# Patient Record
Sex: Female | Born: 1980 | Race: White | Hispanic: No | Marital: Married | Smoking: Former smoker
Health system: Southern US, Community
[De-identification: ages and names within clinical notes are randomized; demographics above are authoritative.]

## PROBLEM LIST (undated history)

## (undated) DIAGNOSIS — E162 Hypoglycemia, unspecified: Secondary | ICD-10-CM

## (undated) DIAGNOSIS — G540 Brachial plexus disorders: Secondary | ICD-10-CM

## (undated) DIAGNOSIS — F419 Anxiety disorder, unspecified: Secondary | ICD-10-CM

## (undated) DIAGNOSIS — K589 Irritable bowel syndrome without diarrhea: Secondary | ICD-10-CM

## (undated) DIAGNOSIS — R12 Heartburn: Secondary | ICD-10-CM

## (undated) DIAGNOSIS — R300 Dysuria: Secondary | ICD-10-CM

## (undated) DIAGNOSIS — E282 Polycystic ovarian syndrome: Secondary | ICD-10-CM

## (undated) HISTORY — DX: Polycystic ovarian syndrome: E28.2

## (undated) HISTORY — PX: APPENDECTOMY: SHX54

## (undated) HISTORY — DX: Brachial plexus disorders: G54.0

## (undated) HISTORY — PX: TONSILLECTOMY: SUR1361

## (undated) HISTORY — DX: Heartburn: R12

## (undated) HISTORY — DX: Anxiety disorder, unspecified: F41.9

## (undated) HISTORY — DX: Irritable bowel syndrome, unspecified: K58.9

## (undated) HISTORY — DX: Dysuria: R30.0

## (undated) HISTORY — DX: Hypoglycemia, unspecified: E16.2

---
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
## 2006-07-12 ENCOUNTER — Observation Stay: Payer: Self-pay | Admitting: Obstetrics and Gynecology

## 2006-08-23 ENCOUNTER — Other Ambulatory Visit: Payer: Self-pay

## 2006-08-23 ENCOUNTER — Observation Stay: Payer: Self-pay | Admitting: Obstetrics and Gynecology

## 2006-09-24 ENCOUNTER — Inpatient Hospital Stay: Payer: Self-pay | Admitting: Obstetrics and Gynecology

## 2010-05-31 ENCOUNTER — Ambulatory Visit: Payer: Self-pay | Admitting: Internal Medicine

## 2010-07-14 ENCOUNTER — Ambulatory Visit: Payer: Self-pay | Admitting: Internal Medicine

## 2012-09-14 DIAGNOSIS — K589 Irritable bowel syndrome without diarrhea: Secondary | ICD-10-CM | POA: Insufficient documentation

## 2012-09-14 DIAGNOSIS — F419 Anxiety disorder, unspecified: Secondary | ICD-10-CM | POA: Insufficient documentation

## 2012-09-14 DIAGNOSIS — L02214 Cutaneous abscess of groin: Secondary | ICD-10-CM | POA: Insufficient documentation

## 2012-09-14 DIAGNOSIS — R002 Palpitations: Secondary | ICD-10-CM | POA: Insufficient documentation

## 2012-09-14 DIAGNOSIS — J302 Other seasonal allergic rhinitis: Secondary | ICD-10-CM | POA: Insufficient documentation

## 2012-09-14 DIAGNOSIS — E282 Polycystic ovarian syndrome: Secondary | ICD-10-CM | POA: Insufficient documentation

## 2012-09-14 DIAGNOSIS — K219 Gastro-esophageal reflux disease without esophagitis: Secondary | ICD-10-CM | POA: Insufficient documentation

## 2013-01-05 DIAGNOSIS — Z23 Encounter for immunization: Secondary | ICD-10-CM | POA: Insufficient documentation

## 2013-07-01 DIAGNOSIS — N390 Urinary tract infection, site not specified: Secondary | ICD-10-CM | POA: Insufficient documentation

## 2013-08-31 ENCOUNTER — Emergency Department: Payer: Self-pay | Admitting: Emergency Medicine

## 2013-08-31 LAB — CBC WITH DIFFERENTIAL/PLATELET
BASOS ABS: 0 10*3/uL (ref 0.0–0.1)
BASOS PCT: 0.6 %
Eosinophil #: 0 10*3/uL (ref 0.0–0.7)
Eosinophil %: 0.3 %
HCT: 40.4 % (ref 35.0–47.0)
HGB: 13.3 g/dL (ref 12.0–16.0)
LYMPHS ABS: 1.1 10*3/uL (ref 1.0–3.6)
LYMPHS PCT: 15.3 %
MCH: 30.3 pg (ref 26.0–34.0)
MCHC: 32.8 g/dL (ref 32.0–36.0)
MCV: 92 fL (ref 80–100)
MONO ABS: 0.6 x10 3/mm (ref 0.2–0.9)
MONOS PCT: 9 %
NEUTROS ABS: 5.3 10*3/uL (ref 1.4–6.5)
Neutrophil %: 74.8 %
PLATELETS: 273 10*3/uL (ref 150–440)
RBC: 4.37 10*6/uL (ref 3.80–5.20)
RDW: 13.9 % (ref 11.5–14.5)
WBC: 7.1 10*3/uL (ref 3.6–11.0)

## 2013-08-31 LAB — URINALYSIS, COMPLETE
BILIRUBIN, UR: NEGATIVE
Blood: NEGATIVE
GLUCOSE, UR: NEGATIVE mg/dL (ref 0–75)
Ketone: NEGATIVE
Leukocyte Esterase: NEGATIVE
Nitrite: NEGATIVE
PROTEIN: NEGATIVE
Ph: 8 (ref 4.5–8.0)
Specific Gravity: 1.011 (ref 1.003–1.030)
Squamous Epithelial: 3
WBC UR: <1 /HPF (ref 0–5)

## 2013-08-31 LAB — PREGNANCY, URINE: Pregnancy Test, Urine: NEGATIVE m[IU]/mL

## 2013-08-31 LAB — COMPREHENSIVE METABOLIC PANEL
ALT: 32 U/L (ref 12–78)
ANION GAP: 8 (ref 7–16)
Albumin: 3.8 g/dL (ref 3.4–5.0)
Alkaline Phosphatase: 56 U/L
BILIRUBIN TOTAL: 0.5 mg/dL (ref 0.2–1.0)
BUN: 13 mg/dL (ref 7–18)
CHLORIDE: 103 mmol/L (ref 98–107)
CO2: 25 mmol/L (ref 21–32)
Calcium, Total: 9.5 mg/dL (ref 8.5–10.1)
Creatinine: 0.56 mg/dL — ABNORMAL LOW (ref 0.60–1.30)
EGFR (African American): >60
EGFR (Non-African Amer.): >60
Glucose: 81 mg/dL (ref 65–99)
Osmolality: 271 (ref 275–301)
Potassium: 4 mmol/L (ref 3.5–5.1)
SGOT(AST): 23 U/L (ref 15–37)
Sodium: 136 mmol/L (ref 136–145)
TOTAL PROTEIN: 7.3 g/dL (ref 6.4–8.2)

## 2013-08-31 LAB — LIPASE, BLOOD: LIPASE: 250 U/L (ref 73–393)

## 2014-11-26 DIAGNOSIS — G8929 Other chronic pain: Secondary | ICD-10-CM | POA: Insufficient documentation

## 2014-11-26 DIAGNOSIS — M545 Low back pain, unspecified: Secondary | ICD-10-CM | POA: Insufficient documentation

## 2015-05-26 DIAGNOSIS — K219 Gastro-esophageal reflux disease without esophagitis: Secondary | ICD-10-CM | POA: Insufficient documentation

## 2015-06-11 IMAGING — US ABDOMEN ULTRASOUND LIMITED
1 series · 14 of 25 positions shown · non-contrast
Comparison: None.

CLINICAL DATA: Right upper quadrant pain

EXAM:
US ABDOMEN LIMITED - RIGHT UPPER QUADRANT

[Series 1: abdomen ultrasound limited · 0.22mm/px · 14 of 47 slices shown]
[im 1/47]
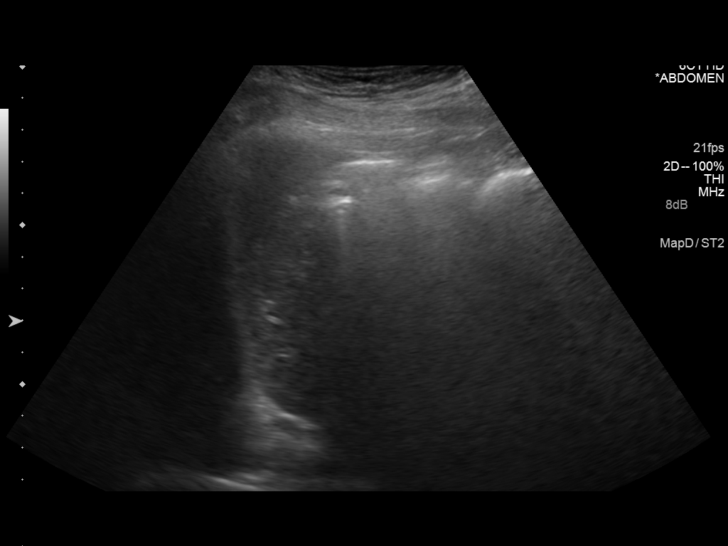
[im 4/47]
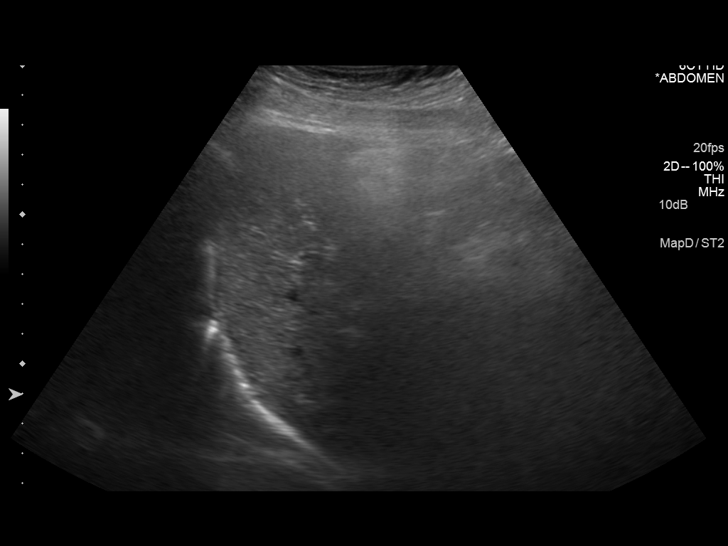
[im 8/47]
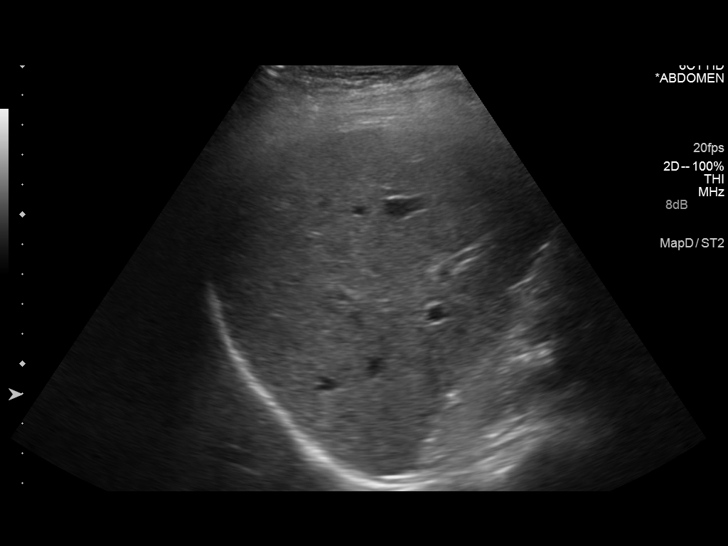
[im 12/47]
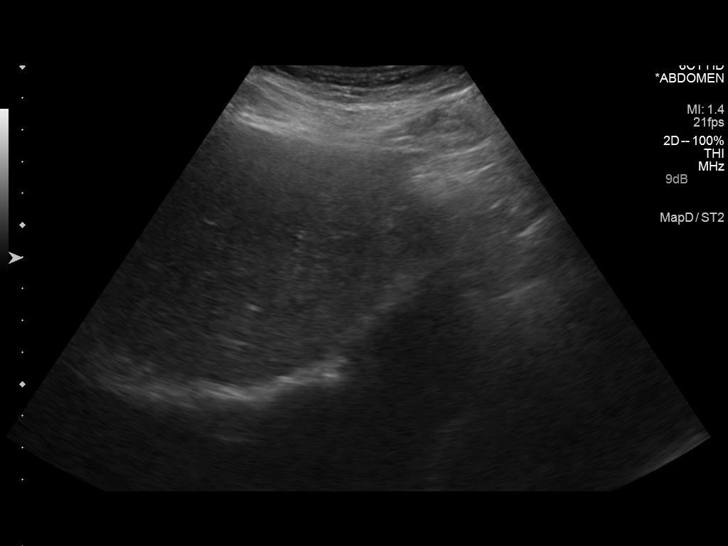
[im 16/47]
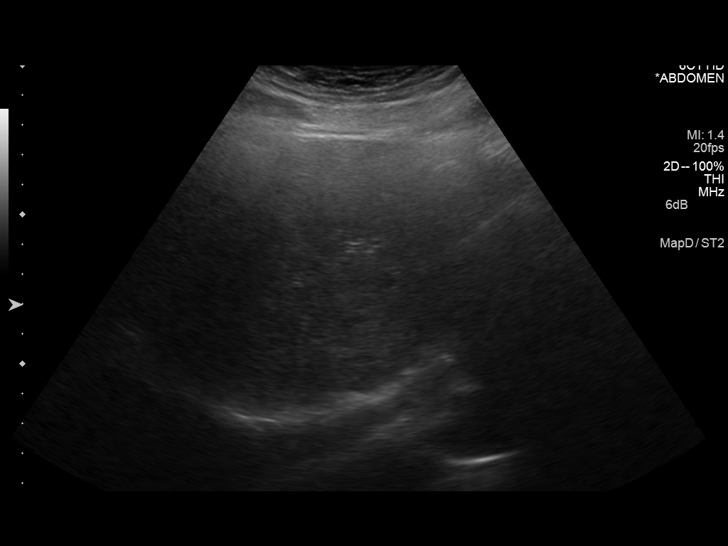
[im 18/47]
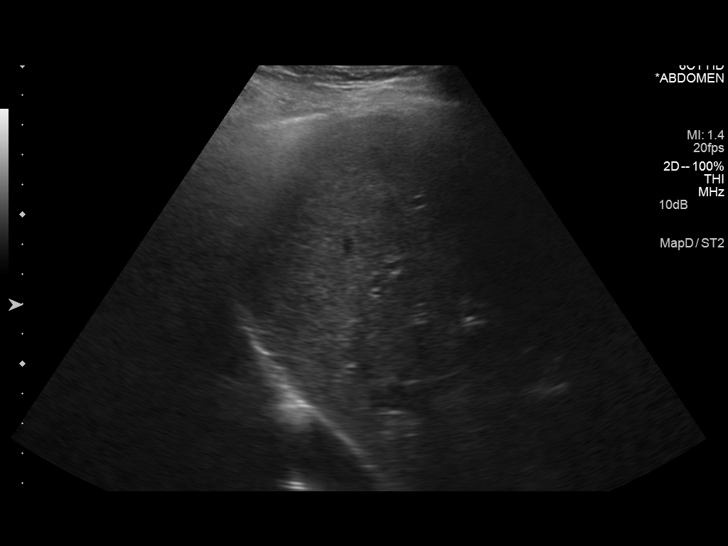
[im 22/47]
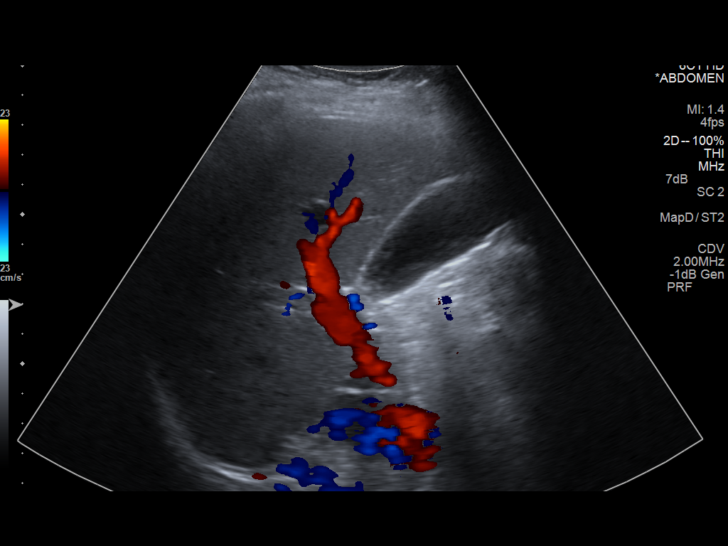
[im 25/47]
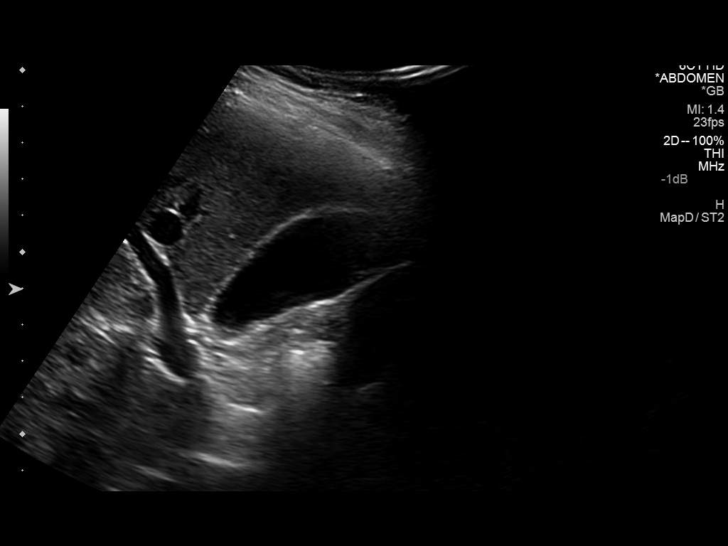
[im 29/47]
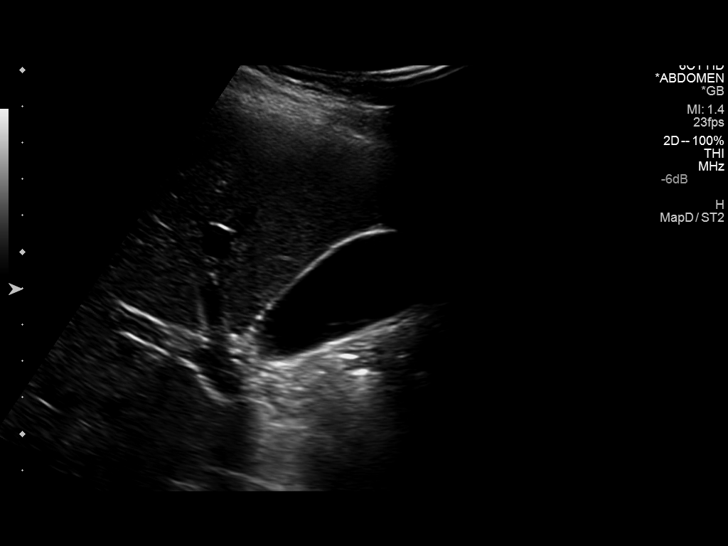
[im 31/47]
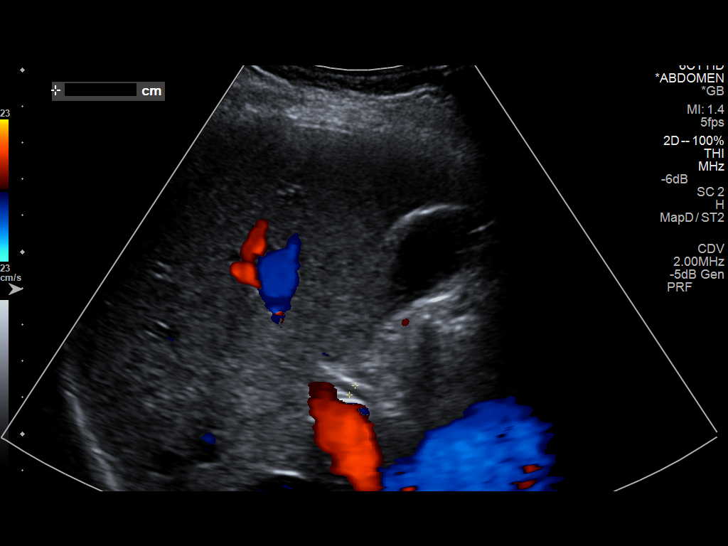
[im 35/47]
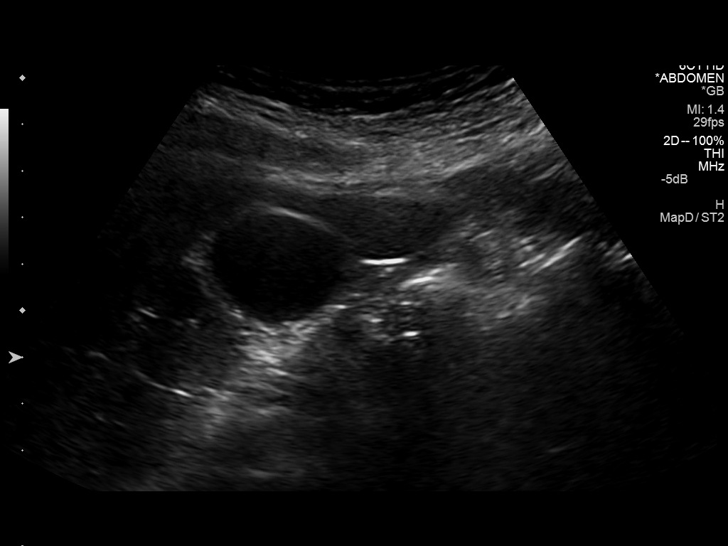
[im 39/47]
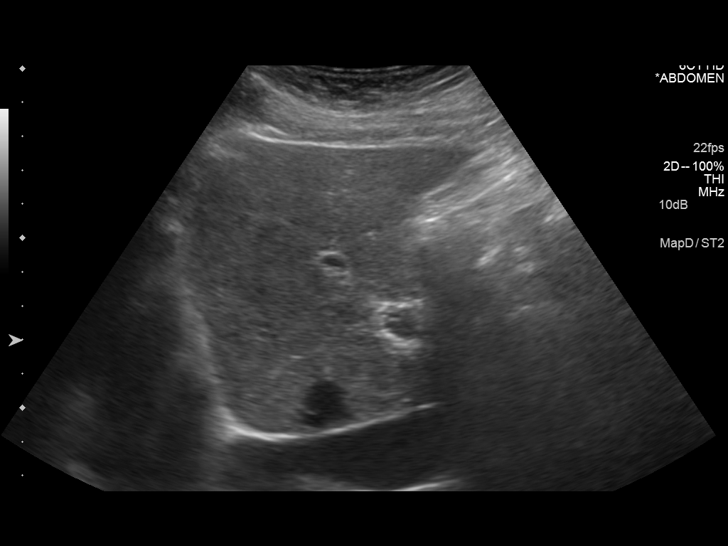
[im 43/47]
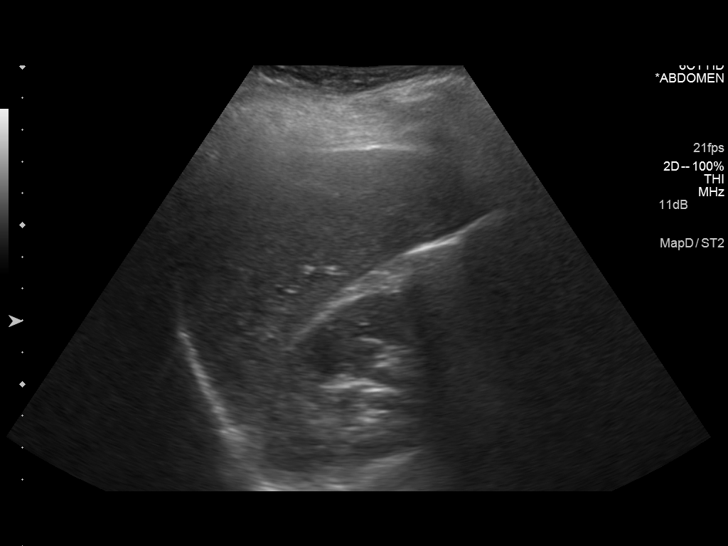
[im 47/47]
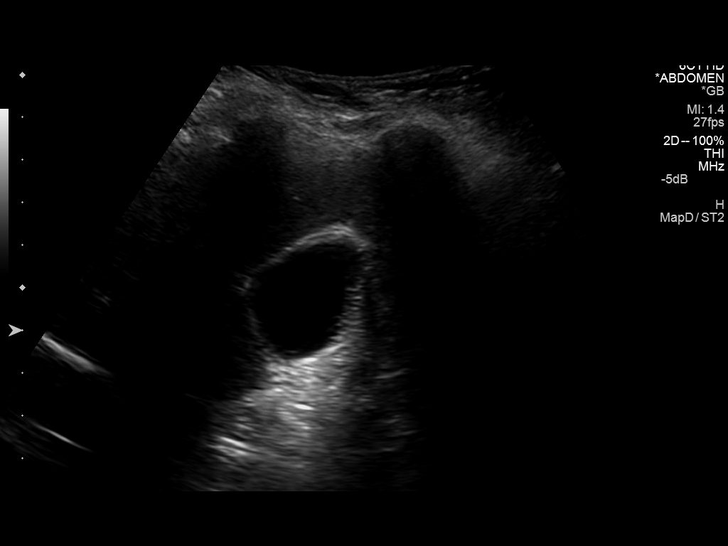

[14 of 25 positions shown; findings below may reference images not displayed]

FINDINGS: Gallbladder:

No gallstones or wall thickening visualized. No sonographic Murphy
sign noted.

Common bile duct:

Diameter: 3 mm

Liver:

No focal lesion identified. Within normal limits in parenchymal
echogenicity.
IMPRESSION: No cholelithiasis or sonographic evidence of acute cholecystitis.

## 2016-01-11 ENCOUNTER — Encounter: Payer: Self-pay | Admitting: Urology

## 2016-01-11 ENCOUNTER — Ambulatory Visit (INDEPENDENT_AMBULATORY_CARE_PROVIDER_SITE_OTHER): Payer: BLUE CROSS/BLUE SHIELD | Admitting: Urology

## 2016-01-11 VITALS — BP 106/71 | HR 69 | Ht 66.0 in | Wt 145.9 lb

## 2016-01-11 DIAGNOSIS — R300 Dysuria: Secondary | ICD-10-CM

## 2016-01-11 DIAGNOSIS — R350 Frequency of micturition: Secondary | ICD-10-CM

## 2016-01-11 DIAGNOSIS — R102 Pelvic and perineal pain: Secondary | ICD-10-CM | POA: Diagnosis not present

## 2016-01-11 DIAGNOSIS — N200 Calculus of kidney: Secondary | ICD-10-CM | POA: Diagnosis not present

## 2016-01-11 LAB — URINALYSIS, COMPLETE
BILIRUBIN UA: NEGATIVE
Glucose, UA: NEGATIVE
Ketones, UA: NEGATIVE
Leukocytes, UA: NEGATIVE
Nitrite, UA: NEGATIVE
PROTEIN UA: NEGATIVE
RBC UA: NEGATIVE
Specific Gravity, UA: 1.015 (ref 1.005–1.030)
UUROB: 0.2 mg/dL (ref 0.2–1.0)
pH, UA: 6.5 (ref 5.0–7.5)

## 2016-01-11 LAB — MICROSCOPIC EXAMINATION: Bacteria, UA: NONE SEEN

## 2016-01-11 LAB — BLADDER SCAN AMB NON-IMAGING: SCAN RESULT: 0

## 2016-01-11 NOTE — Progress Notes (Addendum)
01/11/2016 2:43 PM   Monique Roberts 10/21/1980 [id number]  Referring provider: No referring provider defined for this encounter.  Chief Complaint  Patient presents with  . New Patient (Initial Visit)    Dysuria referred by Doristine Mango    HPI: Patient is a 35 -year-old Caucasian female who is referred to [place] by, Titus Mould NP, for bladder pain and frequency.    She endorses suprapubic pain and low back pain.  She denies dysuria, gross hematuria, abdominal pain and flank pain.  She has not had any recent fevers, chills, nausea or vomiting.   She does not have a history of nephrolithiasis, GU surgery or GU trauma.     Reviewing her records,  she has had no documented UTI's.    She is sexually active.  She has noted a correlation with her symptoms and sexual intercourse.   She has been experiencing dyspareunia for one year.   She does not engage in anal sex.   She is  voiding before and after sex.   She admits to constipation and has been diagnosed with IBS.    She does use tampons.  She does engage in good perineal hygiene. She does not take tub baths.   She is not sure if she is having incontinence.  She finds herself having wet underwear after working out and fluid running down her leg in the shower.  She states it is a thin white fluid.  She is using incontinence pads.    She is having pain with bladder filling.  She has been experiencing suprapubic pain on and off for the last year.  She was given a trial of oxybutynin, but it made her bladder pain worse.    She had a non contrast CT in 2014 which noted a 0.3 cm stone in her left kidney.  She was unaware of that finding.    She is not drinking a lot of water daily.  Her UA today is unremarkable.  Her PVR is 0 mL.     PMH: Past Medical History:  Diagnosis Date  . Anxiety   . Dysuria   . Heartburn   . Hypoglycemia   . IBS (irritable bowel syndrome)   . PCOS (polycystic ovarian syndrome)      Surgical History: Past Surgical History:  Procedure Laterality Date  . APPENDECTOMY    . CESAREAN SECTION    . TONSILLECTOMY      Home Medications:    Medication List       Accurate as of 01/11/16  2:43 PM. Always use your most recent med list.          ACIDOPHILUS LACTOBACILLUS PO Take by mouth.   ALLEGRA-D 12 HOUR PO Take by mouth.   cyclobenzaprine 10 MG tablet Commonly known as:  FLEXERIL Take 10 mg by mouth.   fexofenadine 180 MG tablet Commonly known as:  ALLEGRA Take by mouth.   fluconazole 150 MG tablet Commonly known as:  DIFLUCAN Take 1 tablet PO once. May repeat dose in 72 hours if symptoms persist.   fluticasone 50 MCG/ACT nasal spray Commonly known as:  FLONASE Place into the nose.   hyoscyamine 0.125 MG SL tablet Commonly known as:  LEVSIN SL Place 1 tablet under tongue every 4 hours PRN IBS   hyoscyamine 0.125 MG tablet Commonly known as:  LEVSIN, ANASPAZ Take by mouth.   lansoprazole 15 MG capsule Commonly known as:  PREVACID Take 15 mg by mouth.  metFORMIN 500 MG tablet Commonly known as:  GLUCOPHAGE Take 500 mg by mouth.   MULTIVITAMIN ADULT PO Take by mouth.   oxybutynin 10 MG 24 hr tablet Commonly known as:  DITROPAN-XL Take by mouth.   PROBIOTIC PO Take by mouth.   vitamin C 1000 MG tablet Take 1,000 mg by mouth.   Vitamin D3 2000 units capsule Take by mouth.       Allergies:  Allergies  Allergen Reactions  . Esomeprazole Magnesium Other (See Comments)  . Hydromorphone     Other reaction(s): RASH  . Metoclopramide Other (See Comments)  . Penicillins Other (See Comments)    Family History: Family History  Problem Relation Age of Onset  . Kidney disease Neg Hx   . Bladder Cancer Neg Hx     Social History:  reports that she has quit smoking. Her smoking use included Cigarettes. She has never used smokeless tobacco. She reports that she drinks alcohol. She reports that she does not use  drugs.  ROS: UROLOGY Frequent Urination?: Yes Hard to postpone urination?: Yes Burning/pain with urination?: Yes Get up at night to urinate?: No Leakage of urine?: No Urine stream starts and stops?: No Trouble starting stream?: No Do you have to strain to urinate?: No Blood in urine?: No Urinary tract infection?: No Sexually transmitted disease?: No Injury to kidneys or bladder?: No Painful intercourse?: Yes Weak stream?: No Currently pregnant?: No Vaginal bleeding?: No Last menstrual period?: n  Gastrointestinal Nausea?: No Vomiting?: No Indigestion/heartburn?: Yes Diarrhea?: No Constipation?: Yes  Constitutional Fever: No Night sweats?: No Weight loss?: Yes Fatigue?: No  Skin Skin rash/lesions?: Yes Itching?: Yes  Eyes Blurred vision?: Yes Double vision?: No  Ears/Nose/Throat Sore throat?: No Sinus problems?: Yes  Hematologic/Lymphatic Swollen glands?: No Easy bruising?: No  Cardiovascular Leg swelling?: Yes Chest pain?: No  Respiratory Cough?: No Shortness of breath?: No  Endocrine Excessive thirst?: No  Musculoskeletal Back pain?: Yes Joint pain?: Yes  Neurological Headaches?: No Dizziness?: Yes  Psychologic Depression?: No Anxiety?: No  Physical Exam: BP 106/71   Pulse 69   Ht 5\' 6"  (1.676 m)   Wt 145 lb 14.4 oz (66.2 kg)   LMP 01/11/2016   BMI 23.55 kg/m   Constitutional: Well nourished. Alert and oriented, No acute distress. HEENT: [place] AT, moist mucus membranes. Trachea midline, no masses. Cardiovascular: No clubbing, cyanosis, or edema. Respiratory: Normal respiratory effort, no increased work of breathing. GI: Abdomen is soft, non tender, non distended, no abdominal masses. Liver and spleen not palpable.  No hernias appreciated.  Stool sample for occult testing is not indicated.   GU: No CVA tenderness.  No bladder fullness or masses.  Normal external genitalia, normal pubic hair distribution, no lesions.  Normal  urethral meatus, no lesions, no prolapse, no discharge.   No urethral masses, tenderness and/or tenderness. No bladder fullness, tenderness or masses. Normal vagina mucosa, good estrogen effect, no discharge, no lesions, good pelvic support, no cystocele or rectocele noted.  No cervical motion tenderness.  Uterus is freely mobile and non-fixed.  No adnexal/parametria masses or tenderness noted.  Anus and perineum are without rashes or lesions.    Skin: No rashes, bruises or suspicious lesions. Lymph: No cervical or inguinal adenopathy. Neurologic: Grossly intact, no focal deficits, moving all 4 extremities. Psychiatric: Normal mood and affect.  Laboratory Data: Lab Results  Component Value Date   WBC 7.1 08/31/2013   HGB 13.3 08/31/2013   HCT 40.4 08/31/2013   MCV 92 08/31/2013  PLT 273 08/31/2013    Lab Results  Component Value Date   CREATININE 0.56 (L) 08/31/2013    Lab Results  Component Value Date   AST 23 08/31/2013   Lab Results  Component Value Date   ALT 32 08/31/2013   Urinalysis Unremarkable.  See EPIC.  Pertinent Imaging: Results for SANDA, MAKINSON (MRN [id number]) as of 01/20/2016 09:49  Ref. Range 01/11/2016 14:08  Scan Result Unknown 0    Assessment & Plan:    1. Suprapubic pain  - Urinalysis, Complete  - CULTURE, URINE COMPREHENSIVE  - BLADDER SCAN AMB NON-IMAGING  - explained to the patient that interstitial cystitis is a chronic inflammatory process of the bladder of unknown etiology with protracted exacerbations - her presentation is suspicious for this diagnosis   - it is much more common in women (90%) between 30's to 50's  - it presents with constellation of symptoms including urinary frequency, urgency, suprapubic pressure, and bladder or pelvic pains  - it is a clinical diagnosis of exclusion: must have negative culture studies, no inciting irritating agents to bladder and without bladder neoplasia.  - encouraged the patient to increase her  water intake  2. Frequency  - may be a symptom of IC or a distal ureteral stone, CT is pending  3. History of nephrolithiasis  - Patient with a stone seen in 2014  - obtain a CT Renal stone study to see if this stone is still present and has migrated into the ureter     Return for CT scan report.  These notes generated with voice recognition software. I apologize for typographical errors.  Michiel Cowboy, PA-C  [facility] [address], [address] [place], [place] [address] [phone]

## 2016-01-12 LAB — HCG, SERUM, QUALITATIVE: hCG,Beta Subunit,Qual,Serum: NEGATIVE m[IU]/mL (ref <6)

## 2016-01-14 LAB — CULTURE, URINE COMPREHENSIVE

## 2016-01-20 ENCOUNTER — Telehealth: Payer: Self-pay | Admitting: Urology

## 2016-01-20 NOTE — Telephone Encounter (Signed)
Patient's pregnancy status in EPIC is pregnant.  I saw her on 01/10/2016 and she was having her period and I have have a negative serum pregnancy test from 01/11/2016.  We need to contact the patient to confirm she has not been newly diagnosed with pregnancy, otherwise this will need to be changed in her chart.

## 2016-01-20 NOTE — Telephone Encounter (Signed)
Patient called today asking for an order for her CT scan because she said it will cost to much to go to [facility]. She said she wants it done at the [facility] on [address] and she needs this order by Monday so that she can get it done on Wednesday.  Can this be done?   Thanks,  Monique Roberts

## 2016-01-22 NOTE — Telephone Encounter (Signed)
The only breast imaging center that I know on [address] [facility] and that is affiliated with [facility].

## 2016-01-23 ENCOUNTER — Encounter: Payer: Self-pay | Admitting: Urology

## 2016-01-23 ENCOUNTER — Telehealth: Payer: Self-pay

## 2016-01-23 NOTE — Telephone Encounter (Signed)
Pregnancy status corrected.

## 2016-01-23 NOTE — Telephone Encounter (Signed)
Due to negative pregnancy test pregnancy status was changed to having periods. Pt was having a period at the time of OV and pregnancy test.

## 2016-01-25 ENCOUNTER — Ambulatory Visit
Admission: RE | Admit: 2016-01-25 | Discharge: 2016-01-25 | Disposition: A | Discharge status: Home / Self Care | Payer: BLUE CROSS/BLUE SHIELD | Source: Ambulatory Visit | Attending: Urology | Admitting: Urology

## 2016-01-25 ENCOUNTER — Ambulatory Visit: Payer: BLUE CROSS/BLUE SHIELD

## 2016-01-25 DIAGNOSIS — N200 Calculus of kidney: Secondary | ICD-10-CM | POA: Diagnosis not present

## 2016-02-01 ENCOUNTER — Ambulatory Visit (INDEPENDENT_AMBULATORY_CARE_PROVIDER_SITE_OTHER): Payer: BLUE CROSS/BLUE SHIELD | Admitting: Urology

## 2016-02-01 ENCOUNTER — Encounter: Payer: Self-pay | Admitting: Urology

## 2016-02-01 VITALS — BP 98/67 | HR 80 | Ht 66.0 in | Wt 146.1 lb

## 2016-02-01 DIAGNOSIS — N200 Calculus of kidney: Secondary | ICD-10-CM

## 2016-02-01 DIAGNOSIS — R102 Pelvic and perineal pain: Secondary | ICD-10-CM

## 2016-02-01 DIAGNOSIS — R350 Frequency of micturition: Secondary | ICD-10-CM | POA: Diagnosis not present

## 2016-02-01 NOTE — Progress Notes (Signed)
02/01/2016 9:26 PM   Monique Roberts 12/18/1980 [id number]  Referring provider: Titus Mould, NP [address] [place], [place] [address]  Chief Complaint  Patient presents with  . Follow-up    CT results    HPI: Patient is a 35 year old Caucasian female who presents today to discuss her CT renal stone results.  Background history Patient was referred to [place] by, Titus Mould NP, for bladder pain and frequency.  She endorses suprapubic pain and low back pain.  She denies dysuria, gross hematuria, abdominal pain and flank pain.  She had not had any recent fevers, chills, nausea or vomiting.  She does not have a history of nephrolithiasis, GU surgery or GU trauma.   Reviewing her records,  she has had no documented UTI's.   She is sexually active.  She has noted a correlation with her symptoms and sexual intercourse.   She has been experiencing dyspareunia for one year.   She does not engage in anal sex.   She is voiding before and after sex.  She admits to constipation and has been diagnosed with IBS.  She does use tampons.  She does engage in good perineal hygiene. She does not take tub baths.  She is not sure if she is having incontinence.  She finds herself having wet underwear after working out and fluid running down her leg in the shower.  She states it is a thin white fluid.  She is using incontinence pads.  She is having pain with bladder filling.  She has been experiencing suprapubic pain on and off for the last year.  She was given a trial of oxybutynin, but it made her bladder pain worse.  She had a non contrast CT in 2014 which noted a 0.3 cm stone in her left kidney.  She was unaware of that finding.  She was not drinking a lot of water daily.  Her UA was unremarkable.  Her PVR is 0 mL.    Her CT renal stone study performed on 01/25/2016 noted a left renal calculus but no definite ureteral or bladder calculi.  No acute abdominal/pelvic findings, mass  lesions or adenopathy.   I have independently reviewed the films.  She states that none of her symptoms have changed since her visit on 01/11/2016.  She has tried to increase her water intake, but she admits that she is not drinking as much water as she should.  She states that since her last visit she feels her symptoms do worsen during the time of her menstrual cycle.  She states that sex is more painful during these times as well.    PMH: Past Medical History:  Diagnosis Date  . Anxiety   . Dysuria   . Heartburn   . Hypoglycemia   . IBS (irritable bowel syndrome)   . PCOS (polycystic ovarian syndrome)     Surgical History: Past Surgical History:  Procedure Laterality Date  . APPENDECTOMY    . CESAREAN SECTION    . TONSILLECTOMY      Home Medications:    Medication List       Accurate as of 02/01/16 11:59 PM. Always use your most recent med list.          ACIDOPHILUS LACTOBACILLUS PO Take by mouth.   ALLEGRA-D 12 HOUR PO Take by mouth.   cyclobenzaprine 10 MG tablet Commonly known as:  FLEXERIL Take 10 mg by mouth.   fexofenadine 180 MG tablet Commonly known as:  ALLEGRA Take by mouth.   fluconazole 150 MG tablet Commonly known as:  DIFLUCAN Take 1 tablet PO once. May repeat dose in 72 hours if symptoms persist.   fluticasone 50 MCG/ACT nasal spray Commonly known as:  FLONASE Place into the nose.   hyoscyamine 0.125 MG SL tablet Commonly known as:  LEVSIN SL Place 1 tablet under tongue every 4 hours PRN IBS   hyoscyamine 0.125 MG tablet Commonly known as:  LEVSIN, ANASPAZ Take by mouth.   lansoprazole 15 MG capsule Commonly known as:  PREVACID Take 15 mg by mouth.   metFORMIN 500 MG tablet Commonly known as:  GLUCOPHAGE Take 500 mg by mouth.   MULTIVITAMIN ADULT PO Take by mouth.   oxybutynin 10 MG 24 hr tablet Commonly known as:  DITROPAN-XL Take by mouth.   PROBIOTIC PO Take by mouth.   vitamin C 1000 MG tablet Take 1,000 mg by  mouth.   Vitamin D3 2000 units capsule Take by mouth.       Allergies:  Allergies  Allergen Reactions  . Esomeprazole Magnesium Other (See Comments)  . Hydromorphone     Other reaction(s): RASH  . Metoclopramide Other (See Comments)  . Penicillins Other (See Comments)    Family History: Family History  Problem Relation Age of Onset  . Kidney disease Neg Hx   . Bladder Cancer Neg Hx     Social History:  reports that she has quit smoking. Her smoking use included Cigarettes. She has never used smokeless tobacco. She reports that she drinks alcohol. She reports that she does not use drugs.  ROS: UROLOGY Frequent Urination?: Yes Hard to postpone urination?: Yes Burning/pain with urination?: Yes Get up at night to urinate?: No Leakage of urine?: No Urine stream starts and stops?: No Trouble starting stream?: No Do you have to strain to urinate?: No Blood in urine?: No Urinary tract infection?: No Sexually transmitted disease?: No Injury to kidneys or bladder?: No Painful intercourse?: Yes Weak stream?: No Currently pregnant?: No Vaginal bleeding?: No Last menstrual period?: n  Gastrointestinal Nausea?: No Vomiting?: No Indigestion/heartburn?: Yes Diarrhea?: No Constipation?: Yes  Constitutional Fever: No Night sweats?: No Weight loss?: Yes Fatigue?: No  Skin Skin rash/lesions?: Yes Itching?: Yes  Eyes Blurred vision?: Yes Double vision?: No  Ears/Nose/Throat Sore throat?: No Sinus problems?: Yes  Hematologic/Lymphatic Swollen glands?: No Easy bruising?: No  Cardiovascular Leg swelling?: Yes Chest pain?: No  Respiratory Cough?: No Shortness of breath?: No  Endocrine Excessive thirst?: No  Musculoskeletal Back pain?: Yes Joint pain?: Yes  Neurological Headaches?: No Dizziness?: Yes  Psychologic Depression?: No Anxiety?: No   Physical Exam: BP 98/67 (BP Location: Left Arm, Patient Position: Sitting, Cuff Size:  Normal)   Pulse 80   Ht 5\' 6"  (1.676 m)   Wt 146 lb 1.6 oz (66.3 kg)   LMP 01/11/2016 (Exact Date)   BMI 23.58 kg/m   Constitutional: Well nourished. Alert and oriented, No acute distress. HEENT: [place] AT, moist mucus membranes. Trachea midline, no masses. Cardiovascular: No clubbing, cyanosis, or edema. Respiratory: Normal respiratory effort, no increased work of breathing. Skin: No rashes, bruises or suspicious lesions. Lymph: No cervical or inguinal adenopathy. Neurologic: Grossly intact, no focal deficits, moving all 4 extremities. Psychiatric: Normal mood and affect.  Laboratory Data: Lab Results  Component Value Date   WBC 7.1 08/31/2013   HGB 13.3 08/31/2013   HCT 40.4 08/31/2013   MCV 92 08/31/2013   PLT 273 08/31/2013    Lab Results  Component Value Date   CREATININE 0.56 (L) 08/31/2013    Lab Results  Component Value Date   AST 23 08/31/2013   Lab Results  Component Value Date   ALT 32 08/31/2013    Pertinent Imaging: CLINICAL DATA:  Bold pelvic pain, urinary frequency and bilateral lower extremity swelling.  EXAM: CT ABDOMEN AND PELVIS WITHOUT CONTRAST  TECHNIQUE: Multidetector CT imaging of the abdomen and pelvis was performed following the standard protocol without IV contrast.  COMPARISON:  None  FINDINGS: Lower chest: The lung bases are clear of acute process. No pleural effusion or pulmonary lesions. The heart is normal in size. No pericardial effusion. The distal esophagus and aorta are unremarkable.  Hepatobiliary: No focal hepatic lesions or intrahepatic biliary dilatation. The gallbladder is grossly normal. No common bile duct dilatation.  Pancreas: No mass, inflammation or ductal dilatation.  Spleen: Normal size.  No focal lesions.  Adrenals/Urinary Tract: The adrenal glands are normal.  A left-sided renal calculus is noted.  No right-sided renal calculi.  No definite hydroureteronephrosis. There are multiple small  pelvic calcifications which are most likely phleboliths. No obvious obstructing ureteral calculi or bladder calculi. No renal mass lesions.  Stomach/Bowel: The stomach, duodenum, small bowel and colon are grossly normal without oral contrast. No inflammatory changes, mass lesions or obstructive findings. The appendix is normal.  Vascular/Lymphatic: No significant vascular findings are present. No enlarged abdominal or pelvic lymph nodes.  Reproductive: The uterus is retroverted. Suspect prior C-section defect. The ovaries are grossly normal.  Other: No pelvic mass or adenopathy. No free pelvic fluid collections. No inguinal mass or adenopathy.  Musculoskeletal: No significant bony findings.  IMPRESSION: 1. Left renal calculus but no definite ureteral or bladder calculi. 2. No acute abdominal/pelvic findings, mass lesions or adenopathy.   Electronically Signed   By: Rudie Meyer M.D.   On: 01/25/2016 11:18  Assessment & Plan:    1. Suprapubic pain  - CT scan did not find any etiology for her suprapubic pain   - explained to the patient that interstitial cystitis is a chronic inflammatory process of the bladder of unknown etiology with protracted exacerbations - her presentation is suspicious for this diagnosis   - it is much more common in women (90%) between 30's to 50's  - it presents with constellation of symptoms including urinary frequency, urgency, suprapubic pressure, and bladder or pelvic pains  - it is a clinical diagnosis of exclusion: must have negative culture studies, no inciting irritating agents to bladder and without bladder neoplasia.  - encouraged the patient to increase her water intake  - Offered to schedule the patient for a cystoscopy, but she would like to consult with her gynecologist to see if her PCOS is intubating to her symptoms  2. Frequency  - no ureteral stone seen on CT  - patient will follow up once she has consulted with  gynecology  3. Left renal stone  - Patient with a stone seen in 2014  - obtain a CT Renal stone study demonstrates the stability of the left renal calculi    Return for gynecology's recommendations.  These notes generated with voice recognition software. I apologize for typographical errors.  Michiel Cowboy, PA-C  [facility] [address] [place], [place] [address] [phone]

## 2016-08-15 DIAGNOSIS — Z302 Encounter for sterilization: Secondary | ICD-10-CM | POA: Insufficient documentation

## 2017-07-25 DIAGNOSIS — R29898 Other symptoms and signs involving the musculoskeletal system: Secondary | ICD-10-CM | POA: Insufficient documentation

## 2018-02-24 DIAGNOSIS — G540 Brachial plexus disorders: Secondary | ICD-10-CM | POA: Insufficient documentation

## 2018-02-26 ENCOUNTER — Other Ambulatory Visit (INDEPENDENT_AMBULATORY_CARE_PROVIDER_SITE_OTHER): Payer: Self-pay | Admitting: Vascular Surgery

## 2018-02-26 ENCOUNTER — Ambulatory Visit (INDEPENDENT_AMBULATORY_CARE_PROVIDER_SITE_OTHER): Payer: BLUE CROSS/BLUE SHIELD | Admitting: Vascular Surgery

## 2018-02-26 ENCOUNTER — Encounter (INDEPENDENT_AMBULATORY_CARE_PROVIDER_SITE_OTHER): Payer: Self-pay | Admitting: Vascular Surgery

## 2018-02-26 ENCOUNTER — Ambulatory Visit (INDEPENDENT_AMBULATORY_CARE_PROVIDER_SITE_OTHER): Payer: BLUE CROSS/BLUE SHIELD

## 2018-02-26 VITALS — BP 117/79 | HR 86 | Resp 16 | Ht 66.0 in | Wt 185.0 lb

## 2018-02-26 DIAGNOSIS — M7989 Other specified soft tissue disorders: Secondary | ICD-10-CM | POA: Diagnosis not present

## 2018-02-26 DIAGNOSIS — S60221A Contusion of right hand, initial encounter: Secondary | ICD-10-CM | POA: Diagnosis not present

## 2018-02-26 DIAGNOSIS — M79641 Pain in right hand: Secondary | ICD-10-CM

## 2018-02-26 DIAGNOSIS — G540 Brachial plexus disorders: Secondary | ICD-10-CM

## 2018-02-26 DIAGNOSIS — M79601 Pain in right arm: Secondary | ICD-10-CM

## 2018-02-26 DIAGNOSIS — R200 Anesthesia of skin: Secondary | ICD-10-CM | POA: Diagnosis not present
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
## 2018-04-01 DIAGNOSIS — R5382 Chronic fatigue, unspecified: Secondary | ICD-10-CM | POA: Insufficient documentation

## 2018-04-01 DIAGNOSIS — R202 Paresthesia of skin: Secondary | ICD-10-CM | POA: Insufficient documentation

## 2018-04-03 ENCOUNTER — Other Ambulatory Visit: Payer: Self-pay | Admitting: Neurology

## 2018-04-03 DIAGNOSIS — G35 Multiple sclerosis: Secondary | ICD-10-CM

## 2018-04-11 ENCOUNTER — Ambulatory Visit
Admission: RE | Admit: 2018-04-11 | Discharge: 2018-04-11 | Disposition: A | Discharge status: Home / Self Care | Payer: BLUE CROSS/BLUE SHIELD | Source: Ambulatory Visit | Attending: Neurology | Admitting: Neurology

## 2018-04-11 ENCOUNTER — Encounter (INDEPENDENT_AMBULATORY_CARE_PROVIDER_SITE_OTHER): Payer: Self-pay

## 2018-04-11 DIAGNOSIS — G35 Multiple sclerosis: Secondary | ICD-10-CM | POA: Insufficient documentation

## 2018-04-11 DIAGNOSIS — R29898 Other symptoms and signs involving the musculoskeletal system: Secondary | ICD-10-CM | POA: Insufficient documentation

## 2018-04-17 DIAGNOSIS — M542 Cervicalgia: Secondary | ICD-10-CM | POA: Insufficient documentation

## 2020-01-20 IMAGING — MR MR HEAD W/O CM
9 series · 48 of 48 positions shown · non-contrast
Comparison: None available.

CLINICAL DATA: Initial evaluation for fatigue, muscular and nerve
pain, dizziness. Evaluate for multiple sclerosis.

EXAM:
MRI HEAD WITHOUT CONTRAST
TECHNIQUE: Multiplanar, multiecho pulse sequences of the brain and surrounding
structures were obtained without intravenous contrast.

[Series 2: T1 · sagittal · 5.0mm · 0.45mm/px · 1 of 23 slices shown (1 of 2)]
[im 1/23]
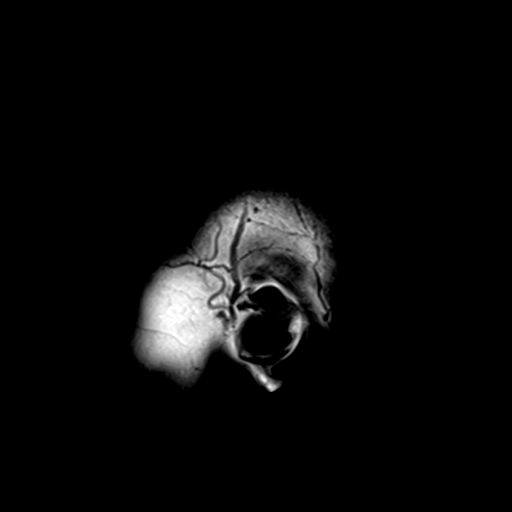

[Series 3: T2 · axial · 5.0mm · 0.72mm/px · z∈[-51,+110]mm · 3 of 24 slices shown (1 of 3)]
[im 1/24]
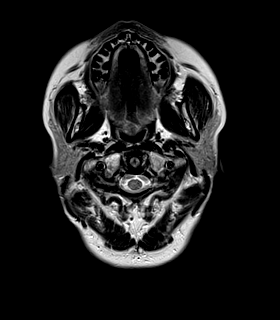
[im 12/24]
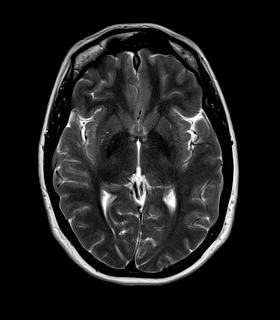
[im 24/24]
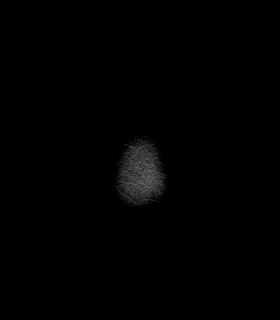

[Series 4: FLAIR · axial · 3.0mm · 0.45mm/px · z∈[-52,+110]mm · 6 of 55 slices shown (1 of 2)]
[im 1/55]
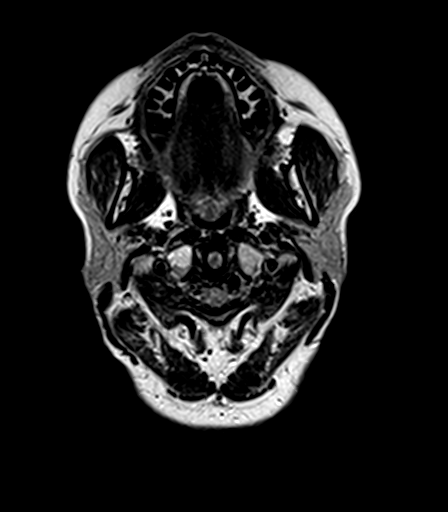
[im 11/55]
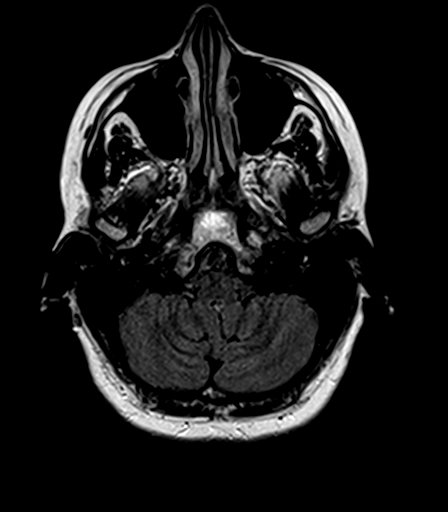
[im 22/55]
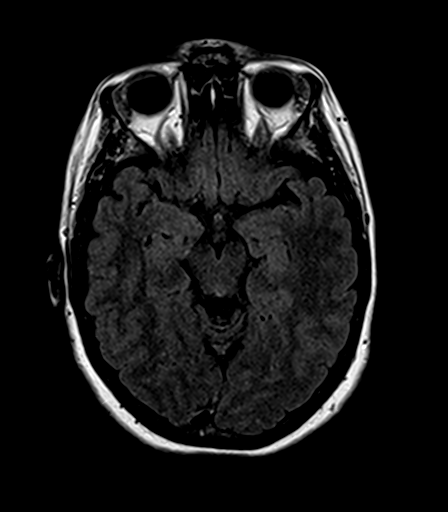
[im 33/55]
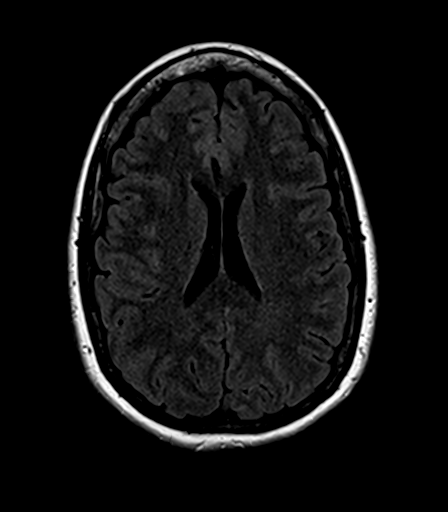
[im 44/55]
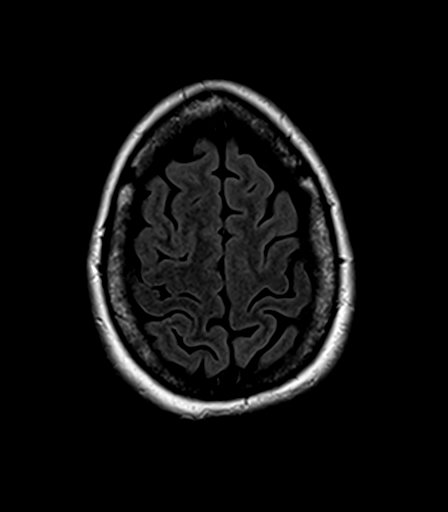
[im 55/55]
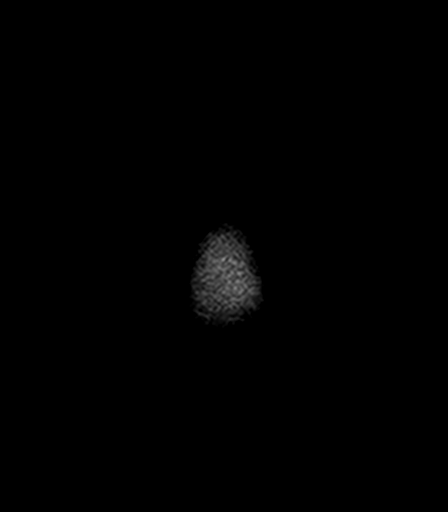

[Series 6: DWI · axial · 3.0mm · 1.20mm/px · z∈[-52,+110]mm · 6 of 55 slices shown (1 of 2)]
[im 1/55]
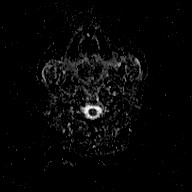
[im 11/55]
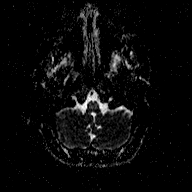
[im 22/55]
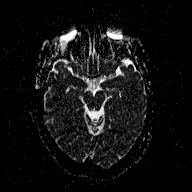
[im 33/55]
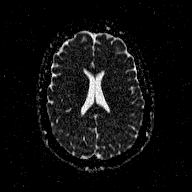
[im 44/55]
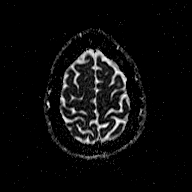
[im 55/55]
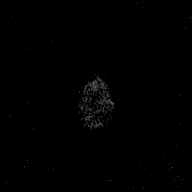

[Series 7: T2 · axial · 5.0mm · 0.72mm/px · z∈[-51,+110]mm · 3 of 24 slices shown (2 of 3)]
[im 1/24]
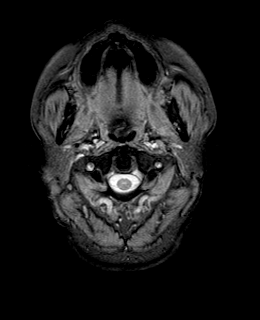
[im 12/24]
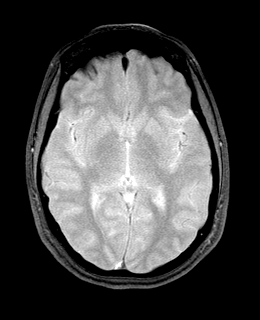
[im 24/24]
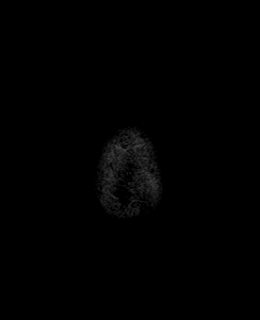

[Series 8: T1 · axial · 1.0mm · 1.00mm/px · z∈[-50,+109]mm · 17 of 160 slices shown (2 of 2)]
[im 1/160]
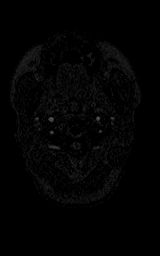
[im 10/160]
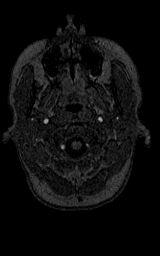
[im 20/160]
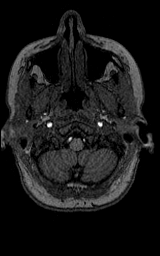
[im 30/160]
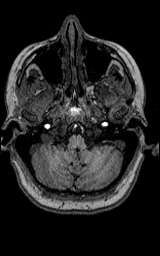
[im 40/160]
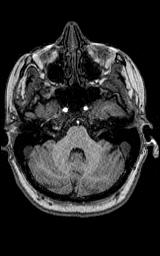
[im 50/160]
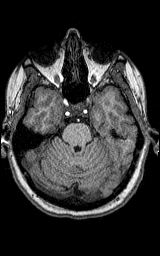
[im 60/160]
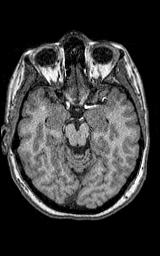
[im 70/160]
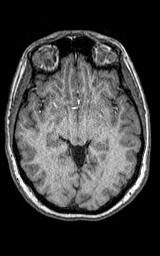
[im 80/160]
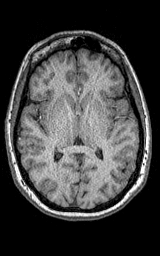
[im 90/160]
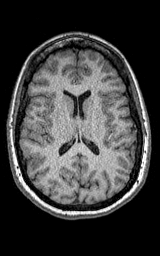
[im 100/160]
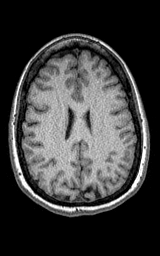
[im 110/160]
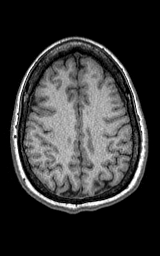
[im 120/160]
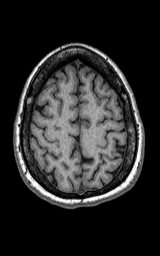
[im 130/160]
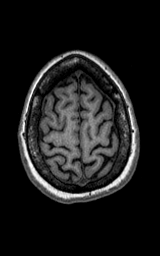
[im 140/160]
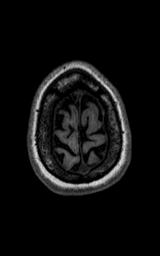
[im 150/160]
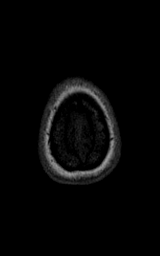
[im 160/160]
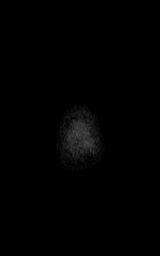

[Series 9: T2 · coronal · 5.0mm · 0.43mm/px · 3 of 29 slices shown (3 of 3)]
[im 1/29]
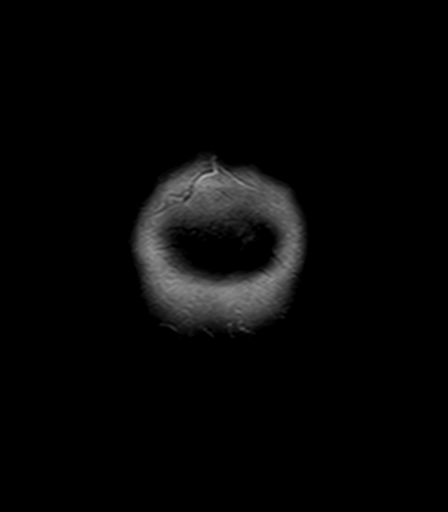
[im 15/29]
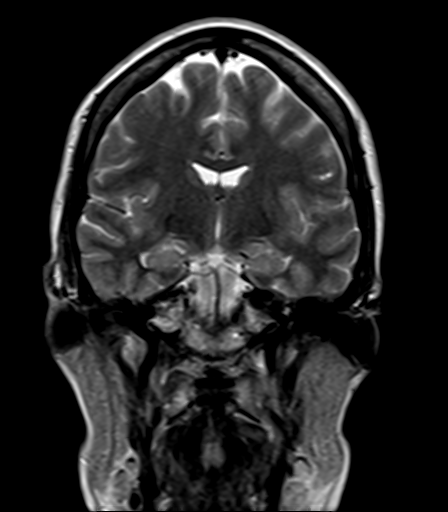
[im 29/29]
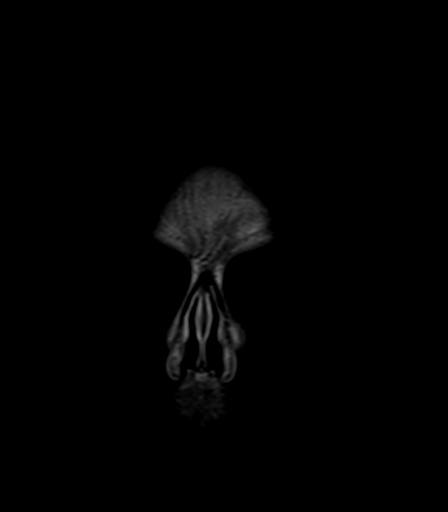

[Series 10: FLAIR · sagittal · 4.0mm · 0.45mm/px · 3 of 30 slices shown (2 of 2)]
[im 1/30]
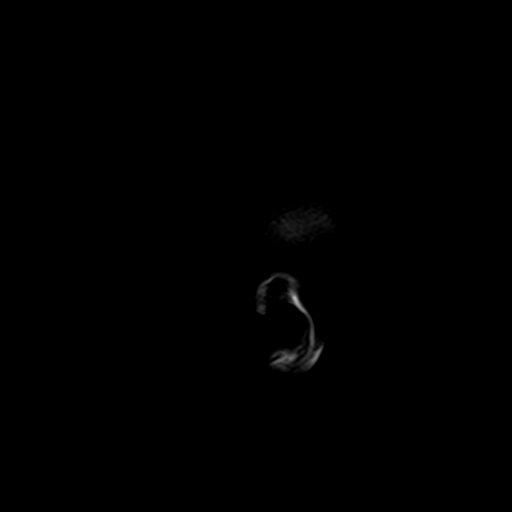
[im 15/30]
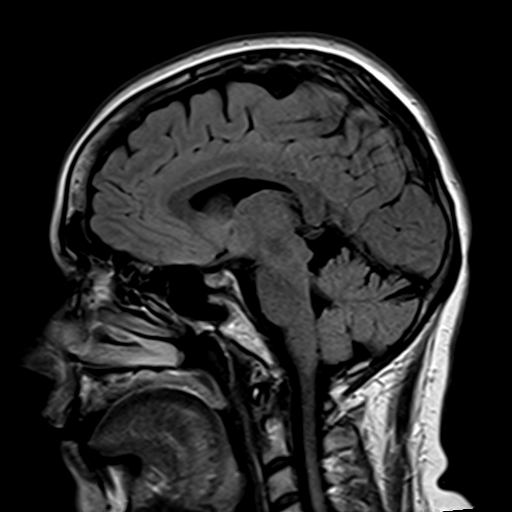
[im 30/30]
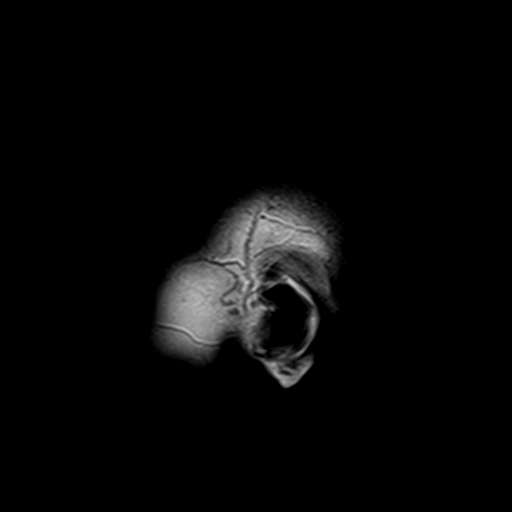

[Series 100: DWI · axial · 3.0mm · 1.20mm/px · z∈[-52,+110]mm · 6 of 55 slices shown (2 of 2)]
[im 1/55]
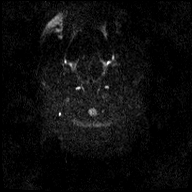
[im 11/55]
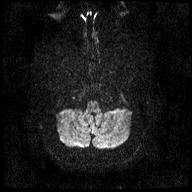
[im 22/55]
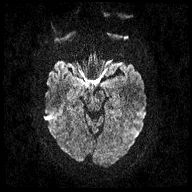
[im 33/55]
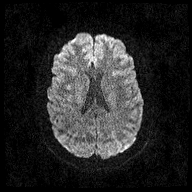
[im 44/55]
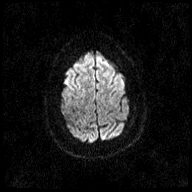
[im 55/55]
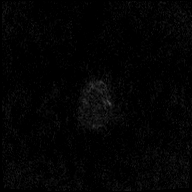

[48 of 48 positions shown; findings below may reference images not displayed]

FINDINGS: Brain: Cerebral volume within normal limits for patient age. No
focal parenchymal signal abnormality identified. No abnormal foci of
restricted diffusion to suggest acute or subacute ischemia.
Gray-white matter differentiation well maintained. No
encephalomalacia to suggest chronic infarction. No foci of
susceptibility artifact to suggest acute or chronic intracranial
hemorrhage.

Mass lesion, midline shift or mass effect. No hydrocephalus. No
extra-axial fluid collection. Major dural sinuses are grossly
patent.

Pituitary gland and suprasellar region are normal. Midline
structures intact and normal.

Vascular: Major intracranial vascular flow voids well maintained and
normal in appearance.

Skull and upper cervical spine: Craniocervical junction normal.
Visualized upper cervical spine within normal limits. Bone marrow
signal intensity normal. No scalp soft tissue abnormality.

Sinuses/Orbits: Globes and orbital soft tissues within normal
limits. Paranasal sinuses are clear. No mastoid effusion. Inner ear
structures normal.

Other: None.
IMPRESSION: Normal brain MRI. No findings to suggest demyelinating disease or
other abnormality.

## 2020-03-24 ENCOUNTER — Ambulatory Visit (INDEPENDENT_AMBULATORY_CARE_PROVIDER_SITE_OTHER): Payer: BC Managed Care – PPO | Admitting: Gastroenterology

## 2020-03-24 ENCOUNTER — Encounter: Payer: Self-pay | Admitting: Gastroenterology

## 2020-03-24 VITALS — BP 113/80 | HR 86 | Temp 98.7°F | Ht 66.0 in | Wt 186.6 lb

## 2020-03-24 DIAGNOSIS — R1013 Epigastric pain: Secondary | ICD-10-CM

## 2020-03-24 DIAGNOSIS — R140 Abdominal distension (gaseous): Secondary | ICD-10-CM

## 2020-03-24 NOTE — Patient Instructions (Signed)
Thank you for allowing [facility] to serve your healthcare needs today.  Dr.Anna has made the following recommendations: 1. Dietary Change Low FOD Map Diet. 2. Activated Charcoal take 1 up to 3 times a day as needed.  Do not take with other medications.  Space out at least an hour before taking other meds. 3.Follow up 2-3 weeks virtual visit.     Low-FODMAP Eating Plan  FODMAPs (fermentable oligosaccharides, disaccharides, monosaccharides, and polyols) are sugars that are hard for some people to digest. A low-FODMAP eating plan may help some people who have bowel (intestinal) diseases to manage their symptoms. This meal plan can be complicated to follow. Work with a diet and nutrition specialist (dietitian) to make a low-FODMAP eating plan that is right for you. A dietitian can make sure that you get enough nutrition from this diet. What are tips for following this plan? Reading food labels  Check labels for hidden FODMAPs such as: ? High-fructose syrup. ? Honey. ? Agave. ? Natural fruit flavors. ? Onion or garlic powder.  Choose low-FODMAP foods that contain 3-4 grams of fiber per serving.  Check food labels for serving sizes. Eat only one serving at a time to make sure FODMAP levels stay low. Meal planning  Follow a low-FODMAP eating plan for up to 6 weeks, or as told by your health care provider or dietitian.  To follow the eating plan: 1. Eliminate high-FODMAP foods from your diet completely. 2. Gradually reintroduce high-FODMAP foods into your diet one at a time. Most people should wait a few days after introducing one high-FODMAP food before they introduce the next high-FODMAP food. Your dietitian can recommend how quickly you may reintroduce foods. 3. Keep a daily record of what you eat and drink, and make note of any symptoms that you have after eating. 4. Review your daily record with a dietitian regularly. Your dietitian can help you identify which foods you can eat and  which foods you should avoid. General tips  Drink enough fluid each day to keep your urine pale yellow.  Avoid processed foods. These often have added sugar and may be high in FODMAPs.  Avoid most dairy products, whole grains, and sweeteners.  Work with a dietitian to make sure you get enough fiber in your diet. Recommended foods Grains  Gluten-free grains, such as rice, oats, buckwheat, quinoa, corn, polenta, and millet. Gluten-free pasta, bread, or cereal. Rice noodles. Corn tortillas. Vegetables  Eggplant, zucchini, cucumber, peppers, green beans, Brussels sprouts, bean sprouts, lettuce, arugula, kale, Swiss chard, spinach, collard greens, bok choy, summer squash, potato, and tomato. Limited amounts of corn, carrot, and sweet potato. Green parts of scallions. Fruits  Bananas, oranges, lemons, limes, blueberries, raspberries, strawberries, grapes, cantaloupe, honeydew melon, kiwi, papaya, passion fruit, and pineapple. Limited amounts of dried cranberries, banana chips, and shredded coconut. Dairy  Lactose-free milk, yogurt, and kefir. Lactose-free cottage cheese and ice cream. Non-dairy milks, such as almond, coconut, hemp, and rice milk. Yogurts made of non-dairy milks. Limited amounts of goat cheese, brie, mozzarella, parmesan, swiss, and other hard cheeses. Meats and other protein foods  Unseasoned beef, pork, poultry, or fish. Eggs. Tomasa Blase. Tofu (firm) and tempeh. Limited amounts of nuts and seeds, such as almonds, walnuts, [place] nuts, pecans, peanuts, pumpkin seeds, chia seeds, and sunflower seeds. Fats and oils  Butter-free spreads. Vegetable oils, such as olive, canola, and sunflower oil. Seasoning and other foods  Artificial sweeteners with names that do not end in "ol" such as aspartame, saccharine, and  stevia. Maple syrup, white table sugar, raw sugar, brown sugar, and molasses. Fresh basil, coriander, parsley, rosemary, and thyme. Beverages  Water and mineral water.  Sugar-sweetened soft drinks. Small amounts of orange juice or cranberry juice. Black and green tea. Most dry wines. Coffee. This may not be a complete list of low-FODMAP foods. Talk with your dietitian for more information. Foods to avoid Grains  Wheat, including kamut, durum, and semolina. Barley and bulgur. Couscous. Wheat-based cereals. Wheat noodles, bread, crackers, and pastries. Vegetables  Chicory root, artichoke, asparagus, cabbage, snow peas, sugar snap peas, mushrooms, and cauliflower. Onions, garlic, leeks, and the white part of scallions. Fruits  Fresh, dried, and juiced forms of apple, pear, watermelon, peach, plum, cherries, apricots, blackberries, boysenberries, figs, nectarines, and mango. Avocado. Dairy  Milk, yogurt, ice cream, and soft cheese. Cream and sour cream. Milk-based sauces. Custard. Meats and other protein foods  Fried or fatty meat. Sausage. Cashews and pistachios. Soybeans, baked beans, black beans, chickpeas, kidney beans, fava beans, navy beans, lentils, and split peas. Seasoning and other foods  Any sugar-free gum or candy. Foods that contain artificial sweeteners such as sorbitol, mannitol, isomalt, or xylitol. Foods that contain honey, high-fructose corn syrup, or agave. Bouillon, vegetable stock, beef stock, and chicken stock. Garlic and onion powder. Condiments made with onion, such as hummus, chutney, pickles, relish, salad dressing, and salsa. Tomato paste. Beverages  Chicory-based drinks. Coffee substitutes. Chamomile tea. Fennel tea. Sweet or fortified wines such as port or sherry. Diet soft drinks made with isomalt, mannitol, maltitol, sorbitol, or xylitol. Apple, pear, and mango juice. Juices with high-fructose corn syrup. This may not be a complete list of high-FODMAP foods. Talk with your dietitian to discuss what dietary choices are best for you.  Summary  A low-FODMAP eating plan is a short-term diet that eliminates FODMAPs from your diet to  help ease symptoms of certain bowel diseases.  The eating plan usually lasts up to 6 weeks. After that, high-FODMAP foods are restarted gradually, one at a time, so you can find out which may be causing symptoms.  A low-FODMAP eating plan can be complicated. It is best to work with a dietitian who has experience with this type of plan. This information is not intended to replace advice given to you by your health care provider. Make sure you discuss any questions you have with your health care provider. Document Revised: 02/15/2017 Document Reviewed: 10/30/2016 Elsevier Patient Education  2020 [employer].

## 2020-03-24 NOTE — Progress Notes (Signed)
Wyline Mood MD, MRCP(U.K) [address]  [address]  [place], [place] [address]  Main: [phone]  Fax: [phone]   Gastroenterology Consultation  Referring Provider:     Dione Housekeeper, * Primary Care Physician:  Dione Housekeeper, MD Primary Gastroenterologist:  Dr. Wyline Mood  Reason for Consultation:   IBS constipation and diarrhea        HPI:   Monique Roberts is a 41 y.o. y/o female referred for consultation & management  by Dr. Zada Finders, Joycie Peek, MD.    She has been referred for IBS.  She was seen in October 2021 at [facility] gastroenterology at [facility].  She was diagnosis with a functional GI disorder.  She was given a course of antispasmodics for right upper quadrant pain.  She was suggested to undergo an x-ray of her abdomen to rule out constipation and a fecal calprotectin.  The fecal calprotectin was normal.  She underwent an EGD that demonstrated no abnormality with the gastric biopsies and no abnormality of esophageal biopsies this was on December 2021.  She had a HIDA scan in November 2021 which showed no evidence of common duct obstruction and an ejection fraction of 94%.  X-ray of the abdomen showed mild colonic stool burden.  She is also being seen by bariatric surgery for right upper quadrant pain.  Plan was if trial of PPI and endoscopy were negative then she will require a cholecystectomy.  10/15/2019: BMP and LFTs normal, CRP normal, hemoglobin 14.3 g. 02/26/2018: Celiac serology negative: Evaluated by [facility] gastroenterology.  I spent over 30 minutes reviewing and summarizing her prior notes and evaluation.  Summarized above.  She states that since June this year she has been having episodes of epigastric discomfort sometimes lower abdominal usually after she eats you can range from a few minutes after 2 hours after and usually lasts for very long time.  Cramping in nature sometimes radiating to the right side of her abdomen and right side of  her chest.  Relieved by belching.  She passes gas which is very foul-smelling.  Denies any constipation.  She tried taking gabapentin which made her symptoms worse.  Denies any consumption of artificial sugars or sweeteners in her diet.  She recollects that after her HIDA scan she drank Ensure and after going home had severe abdominal discomfort with bloating.  Drinking beer makes her symptoms worse.  Denies any weight loss.   Past Medical History:  Diagnosis Date  . Anxiety   . Dysuria   . Heartburn   . Hypoglycemia   . IBS (irritable bowel syndrome)   . PCOS (polycystic ovarian syndrome)     Past Surgical History:  Procedure Laterality Date  . APPENDECTOMY    . CESAREAN SECTION    . TONSILLECTOMY      Prior to Admission medications   Medication Sig Start Date End Date Taking? Authorizing Provider  lansoprazole (PREVACID) 30 MG capsule Take by mouth. 10/21/18  Yes [provider]  Norethindrone Acetate-Ethinyl Estradiol (LOESTRIN) 1.5-30 MG-MCG tablet Take by mouth. 05/19/19  Yes [provider]  Ascorbic Acid (VITAMIN C) 1000 MG tablet Take 1,000 mg by mouth.    [provider]  Cholecalciferol (VITAMIN D3) 2000 units capsule Take by mouth.    [provider]  cyclobenzaprine (FLEXERIL) 10 MG tablet Take 10 mg by mouth. 02/04/12   [provider]  fexofenadine (ALLEGRA) 180 MG tablet Take by mouth.    [provider]  Misc  Natural Products (TUMERSAID) TABS Take by mouth.    [provider]  Multiple Vitamins-Minerals (MULTIVITAMIN ADULT PO) Take by mouth.    [provider]    Family History  Problem Relation Age of Onset  . Kidney disease Neg Hx   . Bladder Cancer Neg Hx      Social History   Tobacco Use  . Smoking status: Former Smoker    Types: Cigarettes  . Smokeless tobacco: Never Used  . Tobacco comment: quit 12 years ago  Substance Use Topics  . Alcohol use: Yes  . Drug use: No    Allergies  as of 03/24/2020 - Review Complete 02/26/2018  Allergen Reaction Noted  . Esomeprazole magnesium Other (See Comments) 09/08/2012  . Hydromorphone  09/08/2012  . Metoclopramide Other (See Comments) 09/08/2012  . Penicillins Other (See Comments) 09/08/2012    Review of Systems:    All systems reviewed and negative except where noted in HPI.   Physical Exam:  There were no vitals taken for this visit. No LMP recorded. Psych:  Alert and cooperative. Normal mood and affect. General:   Alert,  Well-developed, well-nourished, pleasant and cooperative in NAD Head:  Normocephalic and atraumatic. Eyes:  Sclera clear, no icterus.   Conjunctiva pink. Lungs:  Respirations even and unlabored.  Clear throughout to auscultation.   No wheezes, crackles, or rhonchi. No acute distress. Heart:  Regular rate and rhythm; no murmurs, clicks, rubs, or gallops. Abdomen:  Normal bowel sounds.  No bruits.  Soft, non-tender and non-distended without masses, hepatosplenomegaly or hernias noted.  No guarding or rebound tenderness.    Neurologic:  Alert and oriented x3;  grossly normal neurologically. Psych:  Alert and cooperative. Normal mood and affect.  Imaging Studies: No results found.  Assessment and Plan:   Monique Roberts is a 40 y.o. y/o female has been referred for IBS.  She has previously been seen by [facility] gastroenterology [facility].  She has also been seen back in 2019 at [facility] for the same reason at that time celiac serology was negative.  Subsequently seen by the surgical team at [facility] as well.  This is technically the fourth opinion she is obtaining for the same medical condition.  Her history and symptoms is very suggestive of bacterial overgrowth syndrome.  She may also have a food allergy.  I gave her options of empirically treating with antibiotics versus conservative management with a low FODMAP diet and activated charcoal tablets she chose to go with the conservative management.  Plan 1.   Trial of low FODMAP diet, activated charcoal tablets as needed. 2.  Food allergy profile 3.  If no better at next visit we will consider treatment with Xifaxan.    Follow up in 2 weeks telephone visit  Dr Wyline Mood MD,MRCP(U.K)

## 2020-03-27 LAB — FOOD ALLERGY PROFILE
Allergen Corn, IgE: <0.1 kU/L
Clam IgE: <0.1 kU/L
Codfish IgE: <0.1 kU/L
Egg White IgE: <0.1 kU/L
Milk IgE: <0.1 kU/L
Peanut IgE: <0.1 kU/L
Scallop IgE: <0.1 kU/L
Sesame Seed IgE: <0.1 kU/L
Shrimp IgE: <0.1 kU/L
Soybean IgE: <0.1 kU/L
Walnut IgE: <0.1 kU/L
Wheat IgE: <0.1 kU/L

## 2020-03-28 ENCOUNTER — Telehealth: Payer: Self-pay

## 2020-03-28 ENCOUNTER — Encounter: Payer: Self-pay | Admitting: Gastroenterology

## 2020-03-28 NOTE — Telephone Encounter (Signed)
Called to inform patient per Dr. Tobi Bastos her food allergy results were negative. Pt's name was verified on voicemail. LVM to call office if she has any questions.

## 2020-03-28 NOTE — Telephone Encounter (Signed)
-----   Message from Denman George, CMA sent at 03/28/2020 11:16 AM EST -----  ----- Message ----- From: Wyline Mood, MD Sent: 03/28/2020  11:09 AM EST To: Denman George, CMA  Inform food allergy testing was negative

## 2020-04-07 ENCOUNTER — Encounter: Payer: Self-pay | Admitting: Gastroenterology

## 2020-04-07 ENCOUNTER — Other Ambulatory Visit: Payer: Self-pay

## 2020-04-07 ENCOUNTER — Ambulatory Visit (INDEPENDENT_AMBULATORY_CARE_PROVIDER_SITE_OTHER): Payer: BC Managed Care – PPO | Admitting: Gastroenterology

## 2020-04-07 VITALS — BP 104/67 | HR 85 | Temp 99.0°F | Ht 66.0 in | Wt 184.8 lb

## 2020-04-07 DIAGNOSIS — R140 Abdominal distension (gaseous): Secondary | ICD-10-CM | POA: Diagnosis not present

## 2020-04-07 NOTE — Patient Instructions (Signed)
(  4) samples of Xifaxan was given to patient. Instruction to call us back once completed to see how the medication works.

## 2020-04-07 NOTE — Progress Notes (Signed)
Wyline Mood MD, MRCP(U.K) [address]  [address]  [place], [place] [address]  Main: [phone]  Fax: [phone]   Primary Care Physician: Dione Housekeeper, MD  Primary Gastroenterologist:  Dr. Wyline Mood   Follow up for SIBO  HPI: Monique Roberts is a 40 y.o. female   Summary of history :  She was initially referred for IBS in 03/2020.  She was seen in October 2021 at [facility] gastroenterology at [facility].  She was diagnosis with a functional GI disorder.  She was given a course of antispasmodics for right upper quadrant pain.  She was suggested to undergo an x-ray of her abdomen to rule out constipation and a fecal calprotectin.  The fecal calprotectin was normal.  She underwent an EGD that demonstrated no abnormality with the gastric biopsies and no abnormality of esophageal biopsies this was on December 2021.  She had a HIDA scan in November 2021 which showed no evidence of common duct obstruction and an ejection fraction of 94%.  X-ray of the abdomen showed mild colonic stool burden.  She is also being seen by bariatric surgery for right upper quadrant pain.  Plan was if trial of PPI and endoscopy were negative then she will require a cholecystectomy.  She states that since June 2021she has been having episodes of epigastric discomfort sometimes lower abdominal usually after she eats you can range from a few minutes after 2 hours after and usually lasts for very long time.  Cramping in nature sometimes radiating to the right side of her abdomen and right side of her chest.  Relieved by belching.  She passes gas which is very foul-smelling.  Denies any constipation.  She tried taking gabapentin which made her symptoms worse.  Denies any consumption of artificial sugars or sweeteners in her diet.  She recollects that after her HIDA scan she drank Ensure and after going home had severe abdominal discomfort with bloating.  Drinking beer makes her symptoms worse.  Denies any  weight loss.   10/15/2019: BMP and LFTs normal, CRP normal, hemoglobin 14.3 g. 02/26/2018: Celiac serology negative: Evaluated by [facility] gastroenterology.   Interval history   03/24/2020-04/07/2020  Stayed in the low FODMAP diet, took activated charcoal tablets did not help tried probiotics made her symptoms worse.  03/24/2020: Food allergy profile negative.   Current Outpatient Medications  Medication Sig Dispense Refill  . Ascorbic Acid (VITAMIN C) 1000 MG tablet Take 1,000 mg by mouth. (Patient not taking: Reported on 03/24/2020)    . Cholecalciferol (VITAMIN D3) 2000 units capsule Take by mouth.    . cyclobenzaprine (FLEXERIL) 10 MG tablet Take 10 mg by mouth.    . fexofenadine (ALLEGRA) 180 MG tablet Take by mouth.    . gabapentin (NEURONTIN) 100 MG capsule TAKE 2 CAPSULES BY MOUTH NIGHTLY    . lansoprazole (PREVACID) 30 MG capsule Take by mouth.    . Misc Natural Products (TUMERSAID) TABS Take by mouth.    . Multiple Vitamins-Minerals (MULTIVITAMIN ADULT PO) Take by mouth.    . Norethindrone Acetate-Ethinyl Estradiol (LOESTRIN) 1.5-30 MG-MCG tablet Take by mouth.     No current facility-administered medications for this visit.    Allergies as of 04/07/2020 - Review Complete 03/24/2020  Allergen Reaction Noted  . Esomeprazole magnesium Other (See Comments) 09/08/2012  . Hydromorphone  09/08/2012  . Metoclopramide Other (See Comments) 09/08/2012  . Penicillins Other (See Comments) 09/08/2012    ROS:  General: Negative for anorexia, weight loss, fever, chills,  fatigue, weakness. ENT: Negative for hoarseness, difficulty swallowing , nasal congestion. CV: Negative for chest pain, angina, palpitations, dyspnea on exertion, peripheral edema.  Respiratory: Negative for dyspnea at rest, dyspnea on exertion, cough, sputum, wheezing.  GI: See history of present illness. GU:  Negative for dysuria, hematuria, urinary incontinence, urinary frequency, nocturnal urination.  Endo: Negative  for unusual weight change.    Physical Examination:   BP 104/67   Pulse 85   Temp 99 F (37.2 C) (Oral)   Ht 5\' 6"  (1.676 m)   Wt 184 lb 12.8 oz (83.8 kg)   BMI 29.83 kg/m   General: Well-nourished, well-developed in no acute distress.  Eyes: No icterus. Conjunctivae pink. Neuro: Alert and oriented x 3.  Grossly intact. Skin: Warm and dry, no jaundice.   Psych: Alert and cooperative, normal mood and affect.   Imaging Studies: No results found.  Assessment and Plan:   Monique Roberts is a 40 y.o. y/o female  has previously been seen by [facility].  She has also been seen back in 2019 at [facility] for the same reason at that time celiac serology was negative.  Subsequently seen by the surgical team at [facility] as well.  This is technically the fourth opinion she is obtaining for the same medical condition.  Her history and symptoms is very suggestive of bacterial overgrowth syndrome.  Plan 1.   Trial of Xifaxan for SIBO physician samples will be provided for a week if does better she has been instructed to call my office to get a week more of samples to complete a 2-week course of treatment.  Dr [facility]  MD,MRCP [facility]) Follow up in 6 weeks

## 2020-04-14 ENCOUNTER — Telehealth: Payer: Self-pay | Admitting: Gastroenterology

## 2020-04-14 NOTE — Telephone Encounter (Signed)
Patient is requesting more samples of Xifaxan. Please advise.

## 2020-04-14 NOTE — Telephone Encounter (Signed)
Enquire if the 1 week course already given helped then suggest we give her another week , if had no response at all then let me know , should try a different approach

## 2020-04-14 NOTE — Telephone Encounter (Signed)
Patient will pick up another week worth of samples. Informed her they will be at the front desk. Pt verbalized understanding.

## 2020-04-14 NOTE — Telephone Encounter (Signed)
Patient states the [employer] has helped a lot. I gave her another weeks worth of samples.

## 2020-04-14 NOTE — Telephone Encounter (Signed)
Patient called asking for more samples of Rifaxmin 500 mg, patient stated that she needs them asap

## 2022-08-23 ENCOUNTER — Ambulatory Visit (INDEPENDENT_AMBULATORY_CARE_PROVIDER_SITE_OTHER): Payer: No Typology Code available for payment source

## 2022-08-23 ENCOUNTER — Encounter: Payer: Self-pay | Admitting: Cardiology

## 2022-08-23 ENCOUNTER — Ambulatory Visit: Payer: No Typology Code available for payment source | Attending: Cardiology | Admitting: Cardiology

## 2022-08-23 VITALS — BP 122/80 | HR 89 | Ht 66.0 in | Wt 177.0 lb

## 2022-08-23 DIAGNOSIS — E7800 Pure hypercholesterolemia, unspecified: Secondary | ICD-10-CM

## 2022-08-23 DIAGNOSIS — R002 Palpitations: Secondary | ICD-10-CM

## 2022-08-23 DIAGNOSIS — R072 Precordial pain: Secondary | ICD-10-CM

## 2022-08-23 DIAGNOSIS — R600 Localized edema: Secondary | ICD-10-CM | POA: Diagnosis not present

## 2022-08-23 MED ORDER — IVABRADINE HCL 5 MG PO TABS: 10.0000 mg | ORAL_TABLET | Freq: Once | ORAL | 0 refills | Status: AC

## 2022-08-23 MED ORDER — METOPROLOL TARTRATE 100 MG PO TABS: 100.0000 mg | ORAL_TABLET | Freq: Once | ORAL | 0 refills | Status: DC

## 2022-08-23 NOTE — Patient Instructions (Signed)
Medication Instructions:   Your physician recommends that you continue on your current medications as directed. Please refer to the Current Medication list given to you today.  *If you need a refill on your cardiac medications before your next appointment, please call your pharmacy*   Lab Work:  Your physician recommends you go to the medical mall for labs - BMP  If you have labs (blood work) drawn today and your tests are completely normal, you will receive your results only by: MyChart Message (if you have MyChart) OR A paper copy in the mail If you have any lab test that is abnormal or we need to change your treatment, we will call you to review the results.   Testing/Procedures:  Your physician has requested that you have an echocardiogram. Echocardiography is a painless test that uses sound waves to create images of your heart. It provides your doctor with information about the size and shape of your heart and how well your heart's chambers and valves are working. This procedure takes approximately one hour. There are no restrictions for this procedure. Please do NOT wear cologne, perfume, aftershave, or lotions (deodorant is allowed). Please arrive 15 minutes prior to your appointment time.  2. Your physician has recommended that you wear a Zio monitor.   This monitor is a medical device that records the heart's electrical activity. Doctors most often use these monitors to diagnose arrhythmias. Arrhythmias are problems with the speed or rhythm of the heartbeat. The monitor is a small device applied to your chest. You can wear one while you do your normal daily activities. While wearing this monitor if you have any symptoms to push the button and record what you felt. Once you have worn this monitor for the period of time provider prescribed (Usually 14 days), you will return the monitor device in the postage paid box. Once it is returned they will download the data collected and  provide [place] with a report which the provider will then review and we will call you with those results. Important tips:  Avoid showering during the first 24 hours of wearing the monitor. Avoid excessive sweating to help maximize wear time. Do not submerge the device, no hot tubs, and no swimming pools. Keep any lotions or oils away from the patch. After 24 hours you may shower with the patch on. Take brief showers with your back facing the shower head.  Do not remove patch once it has been placed because that will interrupt data and decrease adhesive wear time. Push the button when you have any symptoms and write down what you were feeling. Once you have completed wearing your monitor, remove and place into box which has postage paid and place in your outgoing mailbox.  If for some reason you have misplaced your box then call our office and we can provide another box and/or mail it off for you.    Your cardiac CT will be scheduled at   [facility] [address] [place], [place] [address] [phone]  OR   [facility] [address] [place], [place] [address] [phone]    If scheduled at [facility] or [facility], please arrive 15 mins early for check-in and test prep.   Please follow these instructions carefully (unless otherwise directed):  Hold all erectile dysfunction medications at least 3 days (72 hrs) prior to test. (Ie viagra, cialis, sildenafil, tadalafil, etc) We will administer  nitroglycerin during this exam.   On the Night Before the Test: Be sure to Drink plenty of water. Do not consume any caffeinated/decaffeinated beverages or chocolate 12 hours prior to your test. Do not take any antihistamines 12 hours prior to your test.  On the Day of the Test: Drink plenty of water until 1 hour prior to the test. Do not eat any food 1 hour prior to  test. You may take your regular medications prior to the test.  Take metoprolol (Lopressor) two hours prior to test. Take Corlanor two hours prior to yest FEMALES- please wear underwire-free bra if available, avoid dresses & tight clothing      After the Test: Drink plenty of water. After receiving IV contrast, you may experience a mild flushed feeling. This is normal. On occasion, you may experience a mild rash up to 24 hours after the test. This is not dangerous. If this occurs, you can take Benadryl 25 mg and increase your fluid intake. If you experience trouble breathing, this can be serious. If it is severe call 911 IMMEDIATELY. If it is mild, please call our office.   Follow-Up: At [facility], you and your health needs are our priority.  As part of our continuing mission to provide you with exceptional heart care, we have created designated Provider Care Teams.  These Care Teams include your primary Cardiologist (physician) and Advanced Practice Providers (APPs -  Physician Assistants and Nurse Practitioners) who all work together to provide you with the care you need, when you need it.  We recommend signing up for the patient portal called "MyChart".  Sign up information is provided on this After Visit Summary.  MyChart is used to connect with patients for Virtual Visits (Telemedicine).  Patients are able to view lab/test results, encounter notes, upcoming appointments, etc.  Non-urgent messages can be sent to your provider as well.   To learn more about what you can do with MyChart, go to [email or web address].    Your next appointment:   2 - 3 months   Provider:   You may see Debbe Odea, MD or one of the following Advanced Practice Providers on your designated Care Team:   Nicolasa Ducking, NP Eula Listen, PA-C Cadence Fransico Michael, PA-C Charlsie Quest, NP

## 2022-08-23 NOTE — Progress Notes (Signed)
Cardiology Office Note:    Date:  08/23/2022   ID:  Monique Roberts, DOB 1981/03/06, MRN [id number]  PCP:  Gardiner Coins, PA-C   [facility] Providers Cardiologist:  Debbe Odea, MD     Referring MD: Dione Housekeeper, *   Chief Complaint  Patient presents with   New Patient (Initial Visit)    Patient is concerned with LE edema from knees down that worsens with walking regimen.  Episodes of palpitations, chronic tachycardic, and chest pains.  No cardiac history.   Monique Roberts is a 42 y.o. female who is being seen today for the evaluation of chest pain at the request of Dione Housekeeper, *.   History of Present Illness:    Monique Roberts is a 42 y.o. female with a hx of GERD, hyperlipidemia, former smoker x 10 years, PCOS, R. thoracic outlet syndrome who presents due to chest pain and leg edema.  Endorses leg edema ongoing for several months.  Symptoms usually occur after patient walks.  Edema is usually better in the morning upon waking.  She has noticed occasional chest pains, not always associated with exertion.  Also has palpitations occurring almost daily.  Wore a cardiac monitor several years ago, was told no significant arrhythmias was found.  Has elevated cholesterol managed with diet.  Past Medical History:  Diagnosis Date   Anxiety    Dysuria    Heartburn    Hypoglycemia    IBS (irritable bowel syndrome)    PCOS (polycystic ovarian syndrome)    Thoracic outlet syndrome     Past Surgical History:  Procedure Laterality Date   APPENDECTOMY     CESAREAN SECTION     TONSILLECTOMY      Current Medications: Current Meds  Medication Sig   Cholecalciferol (VITAMIN D3) 2000 units capsule Take by mouth.   cyclobenzaprine (FLEXERIL) 10 MG tablet Take 10 mg by mouth 3 (three) times daily as needed for muscle spasms.   fexofenadine (ALLEGRA) 180 MG tablet Take 180 mg by mouth daily as needed for allergies.   ivabradine  (CORLANOR) 5 MG TABS tablet Take 2 tablets (10 mg total) by mouth once for 1 dose. TWO HOURS PRIOR TO CARDIAC CTA   lansoprazole (PREVACID) 30 MG capsule Take by mouth.   metoprolol tartrate (LOPRESSOR) 100 MG tablet Take 1 tablet (100 mg total) by mouth once for 1 dose. TWO HOURS PRIOR TO CARDIAC CTA   Multiple Vitamins-Minerals (MULTIVITAMIN ADULT PO) Take by mouth.   norethindrone-ethinyl estradiol (LOESTRIN) 1-20 MG-MCG tablet Take 1 tablet by mouth daily.     Allergies:   Esomeprazole magnesium, Hydromorphone, Metoclopramide, and Penicillins   Social History   Socioeconomic History   Marital status: Married    Spouse name: Not on file   Number of children: Not on file   Years of education: Not on file   Highest education level: Not on file  Occupational History   Not on file  Tobacco Use   Smoking status: Former    Types: Cigarettes   Smokeless tobacco: Never   Tobacco comments:    quit 12 years ago  Vaping Use   Vaping Use: Never used  Substance and Sexual Activity   Alcohol use: Yes   Drug use: No   Sexual activity: Not on file  Other Topics Concern   Not on file  Social History Narrative   Not on file   Social Determinants of Health   Financial Resource Strain: Not  on file  Food Insecurity: Not on file  Transportation Needs: Not on file  Physical Activity: Not on file  Stress: Not on file  Social Connections: Not on file     Family History: The patient's family history is negative for Kidney disease and Bladder Cancer.  ROS:   Please see the history of present illness.     All other systems reviewed and are negative.  EKGs/Labs/Other Studies Reviewed:    The following studies were reviewed today:   EKG:  EKG is  ordered today.  The ekg ordered today demonstrates normal sinus rhythm, normal ECG.  Recent Labs: No results found for requested labs within last 365 days.  Recent Lipid Panel No results found for: "CHOL", "TRIG", "HDL", "CHOLHDL",  "VLDL", "LDLCALC", "LDLDIRECT"   Risk Assessment/Calculations:             Physical Exam:    VS:  BP 122/80 (BP Location: Right Arm, Patient Position: Sitting, Cuff Size: Normal)   Pulse 89   Ht 5\' 6"  (1.676 m)   Wt 177 lb (80.3 kg)   SpO2 100%   BMI 28.57 kg/m     Wt Readings from Last 3 Encounters:  08/23/22 177 lb (80.3 kg)  04/07/20 184 lb 12.8 oz (83.8 kg)  03/24/20 186 lb 9.6 oz (84.6 kg)     GEN:  Well nourished, well developed in no acute distress HEENT: Normal NECK: No JVD; No carotid bruits CARDIAC: RRR, no murmurs, rubs, gallops RESPIRATORY:  Clear to auscultation without rales, wheezing or rhonchi  ABDOMEN: Soft, non-tender, non-distended MUSCULOSKELETAL:  No edema; No deformity  SKIN: Warm and dry NEUROLOGIC:  Alert and oriented x 3 PSYCHIATRIC:  Normal affect   ASSESSMENT:    1. Precordial pain   2. Leg edema   3. Palpitations   4. Pure hypercholesterolemia    PLAN:    In order of problems listed above:  Chest pain, some risk factors including hyperlipidemia, former smoker.  GERD also a possibility.  Get echo, coronary CTA. Leg edema, appears dependent.  Echo as above to rule out any structural abnormalities. Palpitations, place cardiac monitor to evaluate any significant arrhythmias. Hyperlipidemia, continue low-cholesterol diet.  Follow-up after cardiac testing.      Medication Adjustments/Labs and Tests Ordered: Current medicines are reviewed at length with the patient today.  Concerns regarding medicines are outlined above.  Orders Placed This Encounter  Procedures   Basic Metabolic Panel (BMET)   LONG TERM MONITOR (3-14 DAYS)   EKG 12-Lead   ECHOCARDIOGRAM COMPLETE   Meds ordered this encounter  Medications   metoprolol tartrate (LOPRESSOR) 100 MG tablet    Sig: Take 1 tablet (100 mg total) by mouth once for 1 dose. TWO HOURS PRIOR TO CARDIAC CTA    Dispense:  1 tablet    Refill:  0   ivabradine (CORLANOR) 5 MG TABS tablet     Sig: Take 2 tablets (10 mg total) by mouth once for 1 dose. TWO HOURS PRIOR TO CARDIAC CTA    Dispense:  2 tablet    Refill:  0    Patient Instructions  Medication Instructions:   Your physician recommends that you continue on your current medications as directed. Please refer to the Current Medication list given to you today.  *If you need a refill on your cardiac medications before your next appointment, please call your pharmacy*   Lab Work:  Your physician recommends you go to the medical mall for labs - BMP  If you have labs (blood work) drawn today and your tests are completely normal, you will receive your results only by: MyChart Message (if you have MyChart) OR A paper copy in the mail If you have any lab test that is abnormal or we need to change your treatment, we will call you to review the results.   Testing/Procedures:  Your physician has requested that you have an echocardiogram. Echocardiography is a painless test that uses sound waves to create images of your heart. It provides your doctor with information about the size and shape of your heart and how well your heart's chambers and valves are working. This procedure takes approximately one hour. There are no restrictions for this procedure. Please do NOT wear cologne, perfume, aftershave, or lotions (deodorant is allowed). Please arrive 15 minutes prior to your appointment time.  2. Your physician has recommended that you wear a Zio monitor.   This monitor is a medical device that records the heart's electrical activity. Doctors most often use these monitors to diagnose arrhythmias. Arrhythmias are problems with the speed or rhythm of the heartbeat. The monitor is a small device applied to your chest. You can wear one while you do your normal daily activities. While wearing this monitor if you have any symptoms to push the button and record what you felt. Once you have worn this monitor for the period of time provider  prescribed (Usually 14 days), you will return the monitor device in the postage paid box. Once it is returned they will download the data collected and provide [place] with a report which the provider will then review and we will call you with those results. Important tips:  Avoid showering during the first 24 hours of wearing the monitor. Avoid excessive sweating to help maximize wear time. Do not submerge the device, no hot tubs, and no swimming pools. Keep any lotions or oils away from the patch. After 24 hours you may shower with the patch on. Take brief showers with your back facing the shower head.  Do not remove patch once it has been placed because that will interrupt data and decrease adhesive wear time. Push the button when you have any symptoms and write down what you were feeling. Once you have completed wearing your monitor, remove and place into box which has postage paid and place in your outgoing mailbox.  If for some reason you have misplaced your box then call our office and we can provide another box and/or mail it off for you.    Your cardiac CT will be scheduled at   [facility] [address] [place], [place] [address] [phone]  OR   [facility] [address] [place], [place] [address] [phone]    If scheduled at [facility] or [facility], please arrive 15 mins early for check-in and test prep.   Please follow these instructions carefully (unless otherwise directed):  Hold all erectile dysfunction medications at least 3 days (72 hrs) prior to test. (Ie viagra, cialis, sildenafil, tadalafil, etc) We will administer nitroglycerin during this exam.   On the Night Before the Test: Be sure to Drink plenty of water. Do not consume any caffeinated/decaffeinated beverages or chocolate 12 hours prior to your test. Do not take any antihistamines  12 hours prior to your test.  On the Day of the Test: Drink plenty of water until 1 hour prior to the test. Do  not eat any food 1 hour prior to test. You may take your regular medications prior to the test.  Take metoprolol (Lopressor) two hours prior to test. Take Corlanor two hours prior to yest FEMALES- please wear underwire-free bra if available, avoid dresses & tight clothing      After the Test: Drink plenty of water. After receiving IV contrast, you may experience a mild flushed feeling. This is normal. On occasion, you may experience a mild rash up to 24 hours after the test. This is not dangerous. If this occurs, you can take Benadryl 25 mg and increase your fluid intake. If you experience trouble breathing, this can be serious. If it is severe call 911 IMMEDIATELY. If it is mild, please call our office.   Follow-Up: At [facility], you and your health needs are our priority.  As part of our continuing mission to provide you with exceptional heart care, we have created designated Provider Care Teams.  These Care Teams include your primary Cardiologist (physician) and Advanced Practice Providers (APPs -  Physician Assistants and Nurse Practitioners) who all work together to provide you with the care you need, when you need it.  We recommend signing up for the patient portal called "MyChart".  Sign up information is provided on this After Visit Summary.  MyChart is used to connect with patients for Virtual Visits (Telemedicine).  Patients are able to view lab/test results, encounter notes, upcoming appointments, etc.  Non-urgent messages can be sent to your provider as well.   To learn more about what you can do with MyChart, go to [email or web address].    Your next appointment:   2 - 3 months   Provider:   You may see Debbe Odea, MD or one of the following Advanced Practice Providers on your designated Care Team:   Nicolasa Ducking, NP Eula Listen,  PA-C Cadence Fransico Michael, PA-C Charlsie Quest, NP   Signed, Debbe Odea, MD  08/23/2022 12:15 PM    [facility]

## 2022-08-28 ENCOUNTER — Other Ambulatory Visit: Payer: Self-pay

## 2022-08-28 DIAGNOSIS — R072 Precordial pain: Secondary | ICD-10-CM

## 2022-08-28 MED ORDER — IVABRADINE HCL 5 MG PO TABS: 10.0000 mg | ORAL_TABLET | Freq: Once | ORAL | 0 refills | Status: AC

## 2022-08-30 ENCOUNTER — Ambulatory Visit: Payer: No Typology Code available for payment source | Attending: Cardiology

## 2022-08-30 DIAGNOSIS — R002 Palpitations: Secondary | ICD-10-CM

## 2022-08-30 DIAGNOSIS — R079 Chest pain, unspecified: Secondary | ICD-10-CM | POA: Diagnosis not present

## 2022-08-30 LAB — ECHOCARDIOGRAM COMPLETE
Area-P 1/2: 3.99 cm2
S' Lateral: 3.2 cm

## 2022-08-31 ENCOUNTER — Encounter: Payer: Self-pay | Admitting: Cardiology

## 2022-10-02 ENCOUNTER — Encounter (HOSPITAL_COMMUNITY): Payer: Self-pay

## 2022-10-04 ENCOUNTER — Other Ambulatory Visit: Payer: No Typology Code available for payment source

## 2022-10-04 ENCOUNTER — Ambulatory Visit
Admission: RE | Admit: 2022-10-04 | Discharge: 2022-10-04 | Disposition: A | Discharge status: Home / Self Care | Payer: No Typology Code available for payment source | Source: Ambulatory Visit | Attending: Cardiology | Admitting: Cardiology

## 2022-10-04 DIAGNOSIS — R072 Precordial pain: Secondary | ICD-10-CM | POA: Diagnosis present

## 2022-10-04 MED ORDER — METOPROLOL TARTRATE 5 MG/5ML IV SOLN
10.0000 mg | Freq: Once | INTRAVENOUS | Status: AC
  Administered 2022-10-04: 10 mg via INTRAVENOUS

## 2022-10-04 MED ORDER — SODIUM CHLORIDE 0.9 % IV BOLUS
150.0000 mL | Freq: Once | INTRAVENOUS | Status: AC
  Administered 2022-10-04: 150 mL via INTRAVENOUS

## 2022-10-04 MED ORDER — NITROGLYCERIN 0.4 MG SL SUBL
0.4000 mg | SUBLINGUAL_TABLET | Freq: Once | SUBLINGUAL | Status: AC
  Administered 2022-10-04: 0.4 mg via SUBLINGUAL

## 2022-10-04 MED ORDER — METOPROLOL TARTRATE 5 MG/5ML IV SOLN: 10.0000 mg | Freq: Once | INTRAVENOUS | Status: DC

## 2022-10-04 MED ORDER — NITROGLYCERIN 0.4 MG SL SUBL: 0.8000 mg | SUBLINGUAL_TABLET | Freq: Once | SUBLINGUAL | Status: DC

## 2022-10-04 MED ORDER — IOHEXOL 350 MG/ML SOLN
75.0000 mL | Freq: Once | INTRAVENOUS | Status: AC | PRN
  Administered 2022-10-04: 75 mL via INTRAVENOUS

## 2022-11-08 ENCOUNTER — Ambulatory Visit: Payer: No Typology Code available for payment source | Attending: Cardiology | Admitting: Cardiology

## 2022-11-08 ENCOUNTER — Encounter: Payer: Self-pay | Admitting: Cardiology

## 2022-11-08 VITALS — BP 104/76 | HR 87 | Ht 66.0 in | Wt 185.0 lb

## 2022-11-08 DIAGNOSIS — R072 Precordial pain: Secondary | ICD-10-CM | POA: Diagnosis not present

## 2022-11-08 DIAGNOSIS — E7800 Pure hypercholesterolemia, unspecified: Secondary | ICD-10-CM | POA: Diagnosis not present

## 2022-11-08 DIAGNOSIS — R002 Palpitations: Secondary | ICD-10-CM

## 2022-11-08 DIAGNOSIS — I77810 Thoracic aortic ectasia: Secondary | ICD-10-CM | POA: Diagnosis not present

## 2022-11-08 NOTE — Progress Notes (Signed)
Cardiology Office Note:    Date:  11/08/2022   ID:  Monique Roberts, DOB 05/14/1980, MRN [id number]  PCP:  Gardiner Coins, PA-C   [facility] Providers Cardiologist:  Debbe Odea, MD     Referring MD: Judeth Cornfield*   Chief Complaint  Patient presents with   Follow-up    Discuss cardiac testing results.  Patient denies new or acute cardiac problems/concerns today.      History of Present Illness:    Monique Roberts is a 42 y.o. female with a hx of GERD, hyperlipidemia, former smoker x 10 years, PCOS, R. thoracic outlet syndrome who presents for follow-up.  Previously seen due to chest pain and palpitations.  Echo and coronary CT obtained to evaluate cardiac disease/etiology.  She feels well otherwise.  Also wore a cardiac monitor, had some skin reaction to cardiac monitor.  Presents for testing results.  No new concerns today.  Overall doing okay.   Past Medical History:  Diagnosis Date   Anxiety    Dysuria    Heartburn    Hypoglycemia    IBS (irritable bowel syndrome)    PCOS (polycystic ovarian syndrome)    Thoracic outlet syndrome     Past Surgical History:  Procedure Laterality Date   APPENDECTOMY     CESAREAN SECTION     TONSILLECTOMY      Current Medications: Current Meds  Medication Sig   Cholecalciferol (VITAMIN D3) 2000 units capsule Take by mouth.   cyclobenzaprine (FLEXERIL) 10 MG tablet Take 10 mg by mouth 3 (three) times daily as needed for muscle spasms.   fexofenadine (ALLEGRA) 180 MG tablet Take 180 mg by mouth daily as needed for allergies.   lansoprazole (PREVACID) 30 MG capsule Take by mouth.   Multiple Vitamins-Minerals (MULTIVITAMIN ADULT PO) Take by mouth.   norethindrone-ethinyl estradiol-FE (LOESTRIN FE) 1-20 MG-MCG tablet Take 1 tablet by mouth daily.     Allergies:   Esomeprazole magnesium, Hydromorphone, Metoclopramide, and Penicillins   Social History   Socioeconomic History   Marital  status: Married    Spouse name: Not on file   Number of children: Not on file   Years of education: Not on file   Highest education level: Not on file  Occupational History   Not on file  Tobacco Use   Smoking status: Former    Types: Cigarettes   Smokeless tobacco: Never   Tobacco comments:    quit 12 years ago  Vaping Use   Vaping status: Never Used  Substance and Sexual Activity   Alcohol use: Yes   Drug use: No   Sexual activity: Not on file  Other Topics Concern   Not on file  Social History Narrative   Not on file   Social Determinants of Health   Financial Resource Strain: Not on file  Food Insecurity: Not on file  Transportation Needs: Patient Declined (05/17/2022)   Received from [facility], [facility]   PRAPARE - Transportation    In the past 12 months, has lack of transportation kept you from medical appointments or from getting medications?: Patient declined    Lack of Transportation (Non-Medical): Patient declined  Physical Activity: Not on file  Stress: Not on file  Social Connections: Not on file     Family History: The patient's family history is negative for Kidney disease and Bladder Cancer.  ROS:   Please see the history of present illness.  All other systems reviewed and are negative.  EKGs/Labs/Other Studies Reviewed:    The following studies were reviewed today:   EKG:  EKG not ordered today.   Recent Labs: No results found for requested labs within last 365 days.  Recent Lipid Panel No results found for: "CHOL", "TRIG", "HDL", "CHOLHDL", "VLDL", "LDLCALC", "LDLDIRECT"   Risk Assessment/Calculations:             Physical Exam:    VS:  BP 104/76 (BP Location: Left Arm, Patient Position: Sitting, Cuff Size: Normal)   Pulse 87   Ht 5\' 6"  (1.676 m)   Wt 185 lb (83.9 kg)   SpO2 99%   BMI 29.86 kg/m     Wt Readings from Last 3 Encounters:  11/08/22 185 lb (83.9 kg)  08/23/22 177 lb (80.3  kg)  04/07/20 184 lb 12.8 oz (83.8 kg)     GEN:  Well nourished, well developed in no acute distress HEENT: Normal NECK: No JVD; No carotid bruits CARDIAC: RRR, no murmurs, rubs, gallops RESPIRATORY:  Clear to auscultation without rales, wheezing or rhonchi  ABDOMEN: Soft, non-tender, non-distended MUSCULOSKELETAL:  No edema; No deformity  SKIN: Warm and dry NEUROLOGIC:  Alert and oriented x 3 PSYCHIATRIC:  Normal affect   ASSESSMENT:    1. Precordial pain   2. Palpitations   3. Pure hypercholesterolemia   4. Ascending aorta dilatation (HCC)     PLAN:    In order of problems listed above:  Chest pain, coronary CT with no CAD.  Echo with normal EF, 55 to 60%. Palpitations, cardiac monitor with no significant arrhythmias. Hyperlipidemia, continue low-cholesterol diet. Borderline/mild ascending aorta dilatation, 3.1 cm.  Repeat echo in 1 year, if stable, will reduce frequency to every 3 to 5 years.  Follow-up in 1 year      Medication Adjustments/Labs and Tests Ordered: Current medicines are reviewed at length with the patient today.  Concerns regarding medicines are outlined above.  Orders Placed This Encounter  Procedures   ECHOCARDIOGRAM COMPLETE   No orders of the defined types were placed in this encounter.   Patient Instructions  Medication Instructions:   Your physician recommends that you continue on your current medications as directed. Please refer to the Current Medication list given to you today.  *If you need a refill on your cardiac medications before your next appointment, please call your pharmacy*   Lab Work:  None Ordered  If you have labs (blood work) drawn today and your tests are completely normal, you will receive your results only by: MyChart Message (if you have MyChart) OR A paper copy in the mail If you have any lab test that is abnormal or we need to change your treatment, we will call you to review the  results.   Testing/Procedures:  Your physician has requested that you have an echocardiogram in one year. Echocardiography is a painless test that uses sound waves to create images of your heart. It provides your doctor with information about the size and shape of your heart and how well your heart's chambers and valves are working. This procedure takes approximately one hour. There are no restrictions for this procedure. Please do NOT wear cologne, perfume, aftershave, or lotions (deodorant is allowed). Please arrive 15 minutes prior to your appointment time.    Follow-Up: At [facility], you and your health needs are our priority.  As part of our continuing mission to provide you with exceptional heart care, we have created designated  Provider Care Teams.  These Care Teams include your primary Cardiologist (physician) and Advanced Practice Providers (APPs -  Physician Assistants and Nurse Practitioners) who all work together to provide you with the care you need, when you need it.  We recommend signing up for the patient portal called "MyChart".  Sign up information is provided on this After Visit Summary.  MyChart is used to connect with patients for Virtual Visits (Telemedicine).  Patients are able to view lab/test results, encounter notes, upcoming appointments, etc.  Non-urgent messages can be sent to your provider as well.   To learn more about what you can do with MyChart, go to [email or web address].    Your next appointment:   12 month(s)  Provider:   You may see Debbe Odea, MD or one of the following Advanced Practice Providers on your designated Care Team:   Nicolasa Ducking, NP Eula Listen, PA-C Cadence Fransico Michael, PA-C Charlsie Quest, NP    Signed, Debbe Odea, MD  11/08/2022 11:57 AM    [facility]

## 2022-11-08 NOTE — Patient Instructions (Signed)
Medication Instructions:   Your physician recommends that you continue on your current medications as directed. Please refer to the Current Medication list given to you today.   *If you need a refill on your cardiac medications before your next appointment, please call your pharmacy*   Lab Work:  None Ordered  If you have labs (blood work) drawn today and your tests are completely normal, you will receive your results only by: MyChart Message (if you have MyChart) OR A paper copy in the mail If you have any lab test that is abnormal or we need to change your treatment, we will call you to review the results.   Testing/Procedures:  Your physician has requested that you have an echocardiogram - in one year. Echocardiography is a painless test that uses sound waves to create images of your heart. It provides your doctor with information about the size and shape of your heart and how well your heart's chambers and valves are working. This procedure takes approximately one hour. There are no restrictions for this procedure. Please do NOT wear cologne, perfume, aftershave, or lotions (deodorant is allowed). Please arrive 15 minutes prior to your appointment time.    Follow-Up: At  [facility], you and your health needs are our priority.  As part of our continuing mission to provide you with exceptional heart care, we have created designated Provider Care Teams.  These Care Teams include your primary Cardiologist (physician) and Advanced Practice Providers (APPs -  Physician Assistants and Nurse Practitioners) who all work together to provide you with the care you need, when you need it.  We recommend signing up for the patient portal called "MyChart".  Sign up information is provided on this After Visit Summary.  MyChart is used to connect with patients for Virtual Visits (Telemedicine).  Patients are able to view lab/test results, encounter notes, upcoming appointments, etc.   Non-urgent messages can be sent to your provider as well.   To learn more about what you can do with MyChart, go to [email or web address].com.    Your next appointment:   12 month(s)  Provider:   You may see Brian Agbor-Etang, MD or one of the following Advanced Practice Providers on your designated Care Team:   Christopher Berge, NP Ryan Dunn, PA-C Cadence Furth, PA-C Sheri Hammock, NP  

## 2023-01-10 ENCOUNTER — Other Ambulatory Visit: Payer: Self-pay | Admitting: Orthopedic Surgery

## 2023-01-10 ENCOUNTER — Other Ambulatory Visit: Payer: Self-pay | Admitting: Sports Medicine

## 2023-01-10 DIAGNOSIS — M19031 Primary osteoarthritis, right wrist: Secondary | ICD-10-CM

## 2023-01-10 DIAGNOSIS — M25541 Pain in joints of right hand: Secondary | ICD-10-CM

## 2023-01-10 DIAGNOSIS — Z9889 Other specified postprocedural states: Secondary | ICD-10-CM

## 2023-01-10 DIAGNOSIS — M25461 Effusion, right knee: Secondary | ICD-10-CM

## 2023-01-10 DIAGNOSIS — G8929 Other chronic pain: Secondary | ICD-10-CM

## 2023-01-28 ENCOUNTER — Encounter: Payer: Self-pay | Admitting: Emergency Medicine

## 2023-01-28 ENCOUNTER — Other Ambulatory Visit: Payer: Self-pay

## 2023-01-28 DIAGNOSIS — R112 Nausea with vomiting, unspecified: Secondary | ICD-10-CM | POA: Insufficient documentation

## 2023-01-28 DIAGNOSIS — R1013 Epigastric pain: Secondary | ICD-10-CM | POA: Insufficient documentation

## 2023-01-28 LAB — CBC WITH DIFFERENTIAL/PLATELET
Abs Immature Granulocytes: 0.03 10*3/uL (ref 0.00–0.07)
Basophils Absolute: 0 10*3/uL (ref 0.0–0.1)
Basophils Relative: 0 %
Eosinophils Absolute: 0 10*3/uL (ref 0.0–0.5)
Eosinophils Relative: 0 %
HCT: 42.2 % (ref 36.0–46.0)
Hemoglobin: 13.8 g/dL (ref 12.0–15.0)
Immature Granulocytes: 0 %
Lymphocytes Relative: 4 %
Lymphs Abs: 0.4 10*3/uL — ABNORMAL LOW (ref 0.7–4.0)
MCH: 29.7 pg (ref 26.0–34.0)
MCHC: 32.7 g/dL (ref 30.0–36.0)
MCV: 90.8 fL (ref 80.0–100.0)
Monocytes Absolute: 0.6 10*3/uL (ref 0.1–1.0)
Monocytes Relative: 6 %
Neutro Abs: 8.8 10*3/uL — ABNORMAL HIGH (ref 1.7–7.7)
Neutrophils Relative %: 90 %
Platelets: 361 10*3/uL (ref 150–400)
RBC: 4.65 MIL/uL (ref 3.87–5.11)
RDW: 14.1 % (ref 11.5–15.5)
WBC: 9.8 10*3/uL (ref 4.0–10.5)
nRBC: 0 % (ref 0.0–0.2)

## 2023-01-28 NOTE — ED Triage Notes (Signed)
EMS brings pt in from home for c/o epigastric pain accomp by N/V/D today

## 2023-01-29 ENCOUNTER — Emergency Department: Payer: No Typology Code available for payment source

## 2023-01-29 ENCOUNTER — Emergency Department
Admission: EM | Admit: 2023-01-29 | Discharge: 2023-01-29 | Disposition: A | Discharge status: Home / Self Care | Payer: No Typology Code available for payment source | Attending: Emergency Medicine | Admitting: Emergency Medicine

## 2023-01-29 DIAGNOSIS — R112 Nausea with vomiting, unspecified: Secondary | ICD-10-CM

## 2023-01-29 DIAGNOSIS — R1013 Epigastric pain: Secondary | ICD-10-CM

## 2023-01-29 LAB — COMPREHENSIVE METABOLIC PANEL
ALT: 16 U/L (ref 0–44)
AST: 25 U/L (ref 15–41)
Albumin: 4.1 g/dL (ref 3.5–5.0)
Alkaline Phosphatase: 47 U/L (ref 38–126)
Anion gap: 10 (ref 5–15)
BUN: 22 mg/dL — ABNORMAL HIGH (ref 6–20)
CO2: 17 mmol/L — ABNORMAL LOW (ref 22–32)
Calcium: 8.9 mg/dL (ref 8.9–10.3)
Chloride: 108 mmol/L (ref 98–111)
Creatinine, Ser: 0.69 mg/dL (ref 0.44–1.00)
GFR, Estimated: >60 mL/min (ref >60)
Glucose, Bld: 135 mg/dL — ABNORMAL HIGH (ref 70–99)
Potassium: 3.2 mmol/L — ABNORMAL LOW (ref 3.5–5.1)
Sodium: 135 mmol/L (ref 135–145)
Total Bilirubin: 1.1 mg/dL (ref <1.2)
Total Protein: 7.9 g/dL (ref 6.5–8.1)

## 2023-01-29 LAB — URINALYSIS, ROUTINE W REFLEX MICROSCOPIC
Bilirubin Urine: NEGATIVE
Glucose, UA: NEGATIVE mg/dL
Hgb urine dipstick: NEGATIVE
Ketones, ur: 20 mg/dL — AB
Leukocytes,Ua: NEGATIVE
Nitrite: NEGATIVE
Protein, ur: NEGATIVE mg/dL
Specific Gravity, Urine: 1.032 — ABNORMAL HIGH (ref 1.005–1.030)
pH: 5 (ref 5.0–8.0)

## 2023-01-29 LAB — POC URINE PREG, ED: Preg Test, Ur: NEGATIVE

## 2023-01-29 LAB — LIPASE, BLOOD: Lipase: 41 U/L (ref 11–51)

## 2023-01-29 MED ORDER — DROPERIDOL 2.5 MG/ML IJ SOLN
1.2500 mg | Freq: Once | INTRAMUSCULAR | Status: AC
  Administered 2023-01-29: 1.25 mg via INTRAVENOUS
  Filled 2023-01-29: qty 2

## 2023-01-29 MED ORDER — DICYCLOMINE HCL 10 MG PO CAPS
20.0000 mg | ORAL_CAPSULE | Freq: Once | ORAL | Status: AC
  Administered 2023-01-29: 20 mg via ORAL
  Filled 2023-01-29: qty 2

## 2023-01-29 MED ORDER — SUCRALFATE 1 G PO TABS: 1.0000 g | ORAL_TABLET | Freq: Three times a day (TID) | ORAL | 1 refills | Status: AC

## 2023-01-29 MED ORDER — LACTATED RINGERS IV BOLUS
1000.0000 mL | Freq: Once | INTRAVENOUS | Status: AC
  Administered 2023-01-29: 1000 mL via INTRAVENOUS

## 2023-01-29 MED ORDER — KETOROLAC TROMETHAMINE 30 MG/ML IJ SOLN
15.0000 mg | Freq: Once | INTRAMUSCULAR | Status: AC
  Administered 2023-01-29: 15 mg via INTRAVENOUS
  Filled 2023-01-29: qty 1

## 2023-01-29 NOTE — ED Provider Notes (Signed)
[facility] Provider Note    Event Date/Time   First MD Initiated Contact with Patient 01/29/23 0206     (approximate)   History   Abdominal Pain   HPI  Monique Roberts is a 42 y.o. female who presents to the ED for evaluation of Abdominal Pain   I review a PCP visit from March.  Chronic ankle pain, fibromyalgia diagnosis in the past.  IBS.  History of appendectomy and cesarean section  Patient presents to the ED for evaluation of a few hours of epigastric abdominal discomfort, emesis and diarrhea.  Happened within 10-20 minutes of eating her dinner and has been present for the past few hours with recurrent emesis and diarrhea.  She reports this happens every couple months on a fairly regular basis but this seems to be of a more severe intensity.  No urinary symptoms such as dysuria, hematuria or frequency.  No fevers, syncope or chest pain   Physical Exam   Triage Vital Signs: ED Triage Vitals  Encounter Vitals Group     BP 01/28/23 2324 116/73     Systolic BP Percentile --      Diastolic BP Percentile --      Pulse Rate 01/28/23 2324 (!) 108     Resp 01/28/23 2324 20     Temp 01/28/23 2324 (!) 97.5 F (36.4 C)     Temp Source 01/28/23 2324 Oral     SpO2 01/28/23 2324 99 %     Weight 01/28/23 2303 185 lb (83.9 kg)     Height 01/28/23 2303 5\' 6"  (1.676 m)     Head Circumference --      Peak Flow --      Pain Score 01/28/23 2303 10     Pain Loc --      Pain Education --      Exclude from Growth Chart --     Most recent vital signs: Vitals:   01/29/23 0235 01/29/23 0330  BP: 118/87 95/73  Pulse: 96 83  Resp:  18  Temp:    SpO2: 100% 97%    General: Awake, no distress.  CV:  Good peripheral perfusion.  Resp:  Normal effort.  Abd:  No distention.  Mild epigastric tenderness without peritoneal features.  Benign lower abdomen MSK:  No deformity noted.  Neuro:  No focal deficits appreciated. Other:     ED Results / Procedures  / Treatments   Labs (all labs ordered are listed, but only abnormal results are displayed) Labs Reviewed  CBC WITH DIFFERENTIAL/PLATELET - Abnormal; Notable for the following components:      Result Value   Neutro Abs 8.8 (*)    Lymphs Abs 0.4 (*)    All other components within normal limits  COMPREHENSIVE METABOLIC PANEL - Abnormal; Notable for the following components:   Potassium 3.2 (*)    CO2 17 (*)    Glucose, Bld 135 (*)    BUN 22 (*)    All other components within normal limits  LIPASE, BLOOD  URINALYSIS, ROUTINE W REFLEX MICROSCOPIC  POC URINE PREG, ED    EKG Sinus tachycardia with rate of 106 bpm.  Normal axis and intervals.  No clear signs of acute ischemia  RADIOLOGY RUQ ultrasound interpreted by me without evidence of gallstones or hepatobiliary obstruction.  No signs of cholecystitis  Official radiology report(s): No results found.  PROCEDURES and INTERVENTIONS:  Procedures  Medications  lactated ringers bolus 1,000 mL (1,000 mLs Intravenous  New Bag/Given 01/29/23 0241)  droperidol (INAPSINE) 2.5 MG/ML injection 1.25 mg (1.25 mg Intravenous Given 01/29/23 0236)  ketorolac (TORADOL) 30 MG/ML injection 15 mg (15 mg Intravenous Given 01/29/23 0233)  dicyclomine (BENTYL) capsule 20 mg (20 mg Oral Given 01/29/23 0232)     IMPRESSION / MDM / ASSESSMENT AND PLAN / ED COURSE  I reviewed the triage vital signs and the nursing notes.  Differential diagnosis includes, but is not limited to, gastritis, biliary colic, IBS, pancreatitis, ACS  {Patient presents with symptoms of an acute illness or injury that is potentially life-threatening.  Patient presents with recurrence of chronic intermittent emesis and epigastric pain, possibly gastritis versus her IBS, ultimately suitable for outpatient management.  Mild localized tenderness but no peritoneal features.  Triage tachycardia resolved with fluids and symptomatic management.  Normal CBC.  Metabolic panel with mild  nonanion gap metabolic acidosis.  Normal lipase.  RUQ ultrasound as interpreted by me as reassuring and she has resolution of symptoms, tolerating p.o. and suitable for outpatient management.  Clinical Course as of 01/29/23 0441  Tue Jan 29, 2023  0315 Reassessed, u/s ongoing , she reports feeling better with medications [DS]  0436 Reassessed, patient reports feeling well.  No pain, no nausea.  We discussed ultrasound looks okay by my read, but significant delays with radiology reads and it may be multiple hours before we get this answer.  She is comfortable going home without this radiology final read.  I examined and she is not tender.  She reaffirms no urinary symptoms.  We discussed attempting empiric Carafate and she is agreeable, I sent his prescription. [DS]    Clinical Course User Index [DS] Delton Prairie, MD     FINAL CLINICAL IMPRESSION(S) / ED DIAGNOSES   Final diagnoses:  Epigastric pain  Nausea and vomiting, unspecified vomiting type     Rx / DC Orders   ED Discharge Orders          Ordered    sucralfate (CARAFATE) 1 g tablet  3 times daily with meals & bedtime        01/29/23 0436             Note:  This document was prepared using Dragon voice recognition software and may include unintentional dictation errors.   Delton Prairie, MD 01/29/23 [phone]

## 2023-01-29 NOTE — Discharge Instructions (Addendum)
Try adding the sucralfate/Carafate to your regimen.  This is a medication that helps to bolster the lining of the stomach and reduce effects of acid and acid reflux.  If you develop any further worsening of your symptoms, fevers with your symptoms, please return to the ED

## 2023-10-02 ENCOUNTER — Other Ambulatory Visit

## 2023-10-17 ENCOUNTER — Ambulatory Visit: Payer: Self-pay | Admitting: Cardiology

## 2023-10-17 ENCOUNTER — Ambulatory Visit: Attending: Cardiology

## 2023-10-17 DIAGNOSIS — R072 Precordial pain: Secondary | ICD-10-CM | POA: Diagnosis not present

## 2023-10-17 LAB — ECHOCARDIOGRAM COMPLETE
AR max vel: 3.02 cm2
AV Area VTI: 3.14 cm2
AV Area mean vel: 3.1 cm2
AV Mean grad: 2 mmHg
AV Peak grad: 4.6 mmHg
Ao pk vel: 1.07 m/s
Area-P 1/2: 4.31 cm2
S' Lateral: 2.71 cm

## 2023-10-18 NOTE — Telephone Encounter (Signed)
 Pt is calling because she was confused about the no to 56 to evaluate chest pain. On Echo report. 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 Also she said if Echo is normal does she need to keep f/u appt. 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 She ask that you send her a my chart message.

## 2023-11-12 ENCOUNTER — Ambulatory Visit: Admitting: Cardiology
